# Patient Record
Sex: Female | Born: 1950 | Race: White | Hispanic: No | Smoking: Never smoker
Health system: Southern US, Community
[De-identification: ages and names within clinical notes are randomized; demographics above are authoritative.]

## PROBLEM LIST (undated history)

## (undated) DIAGNOSIS — I10 Essential (primary) hypertension: Secondary | ICD-10-CM

## (undated) DIAGNOSIS — C801 Malignant (primary) neoplasm, unspecified: Secondary | ICD-10-CM

---

## 2004-07-18 ENCOUNTER — Ambulatory Visit: Payer: Self-pay | Admitting: Obstetrics and Gynecology

## 2005-07-25 ENCOUNTER — Ambulatory Visit: Payer: Self-pay | Admitting: Obstetrics and Gynecology

## 2006-07-31 ENCOUNTER — Ambulatory Visit: Payer: Self-pay | Admitting: Obstetrics and Gynecology

## 2007-08-07 ENCOUNTER — Ambulatory Visit: Payer: Self-pay | Admitting: Obstetrics and Gynecology

## 2008-08-19 ENCOUNTER — Ambulatory Visit: Payer: Self-pay

## 2009-09-01 ENCOUNTER — Ambulatory Visit: Payer: Self-pay

## 2010-09-12 ENCOUNTER — Ambulatory Visit: Payer: Self-pay

## 2011-09-21 ENCOUNTER — Ambulatory Visit: Payer: Self-pay

## 2012-09-23 ENCOUNTER — Ambulatory Visit: Payer: Self-pay

## 2013-10-01 ENCOUNTER — Ambulatory Visit: Payer: Self-pay

## 2014-10-05 ENCOUNTER — Other Ambulatory Visit: Payer: Self-pay | Admitting: Obstetrics and Gynecology

## 2014-10-05 DIAGNOSIS — Z1231 Encounter for screening mammogram for malignant neoplasm of breast: Secondary | ICD-10-CM

## 2014-10-14 ENCOUNTER — Ambulatory Visit
Admission: RE | Admit: 2014-10-14 | Discharge: 2014-10-14 | Disposition: A | Discharge status: Home / Self Care | Payer: BC Managed Care – PPO | Source: Ambulatory Visit | Attending: Obstetrics and Gynecology | Admitting: Obstetrics and Gynecology

## 2014-10-14 DIAGNOSIS — Z1231 Encounter for screening mammogram for malignant neoplasm of breast: Secondary | ICD-10-CM

## 2015-10-11 ENCOUNTER — Other Ambulatory Visit: Payer: Self-pay | Admitting: Obstetrics and Gynecology

## 2015-10-11 DIAGNOSIS — Z1231 Encounter for screening mammogram for malignant neoplasm of breast: Secondary | ICD-10-CM

## 2015-10-19 ENCOUNTER — Ambulatory Visit
Admission: RE | Admit: 2015-10-19 | Discharge: 2015-10-19 | Disposition: A | Discharge status: Home / Self Care | Payer: BC Managed Care – PPO | Source: Ambulatory Visit | Attending: Obstetrics and Gynecology | Admitting: Obstetrics and Gynecology

## 2015-10-19 ENCOUNTER — Other Ambulatory Visit: Payer: Self-pay | Admitting: Obstetrics and Gynecology

## 2015-10-19 DIAGNOSIS — Z1231 Encounter for screening mammogram for malignant neoplasm of breast: Secondary | ICD-10-CM | POA: Diagnosis not present

## 2015-10-20 ENCOUNTER — Other Ambulatory Visit: Payer: Self-pay | Admitting: Obstetrics and Gynecology

## 2015-10-20 DIAGNOSIS — R928 Other abnormal and inconclusive findings on diagnostic imaging of breast: Secondary | ICD-10-CM

## 2015-10-27 ENCOUNTER — Ambulatory Visit
Admission: RE | Admit: 2015-10-27 | Discharge: 2015-10-27 | Disposition: A | Discharge status: Home / Self Care | Payer: Medicare Other | Source: Ambulatory Visit | Attending: Obstetrics and Gynecology | Admitting: Obstetrics and Gynecology

## 2015-10-27 DIAGNOSIS — R928 Other abnormal and inconclusive findings on diagnostic imaging of breast: Secondary | ICD-10-CM | POA: Insufficient documentation

## 2015-10-27 DIAGNOSIS — R590 Localized enlarged lymph nodes: Secondary | ICD-10-CM | POA: Diagnosis not present

## 2015-11-01 ENCOUNTER — Other Ambulatory Visit: Payer: Self-pay | Admitting: Obstetrics and Gynecology

## 2015-11-01 DIAGNOSIS — R590 Localized enlarged lymph nodes: Secondary | ICD-10-CM

## 2015-11-10 ENCOUNTER — Ambulatory Visit
Admission: RE | Admit: 2015-11-10 | Discharge: 2015-11-10 | Disposition: A | Discharge status: Home / Self Care | Payer: Medicare Other | Source: Ambulatory Visit | Attending: Obstetrics and Gynecology | Admitting: Obstetrics and Gynecology

## 2015-11-10 DIAGNOSIS — R590 Localized enlarged lymph nodes: Secondary | ICD-10-CM

## 2016-06-18 ENCOUNTER — Ambulatory Visit
Admission: RE | Admit: 2016-06-18 | Discharge: 2016-06-18 | Disposition: A | Discharge status: Home / Self Care | Payer: Medicare Other | Source: Ambulatory Visit | Attending: Internal Medicine | Admitting: Internal Medicine

## 2016-06-18 ENCOUNTER — Other Ambulatory Visit: Payer: Self-pay | Admitting: Internal Medicine

## 2016-06-18 DIAGNOSIS — C8599 Non-Hodgkin lymphoma, unspecified, extranodal and solid organ sites: Secondary | ICD-10-CM | POA: Diagnosis present

## 2016-06-18 DIAGNOSIS — R590 Localized enlarged lymph nodes: Secondary | ICD-10-CM | POA: Insufficient documentation

## 2016-06-18 HISTORY — DX: Essential (primary) hypertension: I10

## 2016-06-18 LAB — POCT I-STAT CREATININE: Creatinine, Ser: 0.8 mg/dL (ref 0.44–1.00)

## 2016-06-18 MED ORDER — IOPAMIDOL (ISOVUE-300) INJECTION 61%
75.0000 mL | Freq: Once | INTRAVENOUS | Status: AC | PRN
  Administered 2016-06-18: 75 mL via INTRAVENOUS

## 2016-06-22 ENCOUNTER — Other Ambulatory Visit: Payer: Self-pay | Admitting: Unknown Physician Specialty

## 2016-06-22 DIAGNOSIS — R221 Localized swelling, mass and lump, neck: Secondary | ICD-10-CM

## 2016-06-29 ENCOUNTER — Other Ambulatory Visit: Payer: Self-pay | Admitting: Physician Assistant

## 2016-07-02 ENCOUNTER — Ambulatory Visit
Admission: RE | Admit: 2016-07-02 | Discharge: 2016-07-02 | Disposition: A | Discharge status: Home / Self Care | Payer: Medicare Other | Source: Ambulatory Visit | Attending: Unknown Physician Specialty | Admitting: Unknown Physician Specialty

## 2016-07-02 DIAGNOSIS — R590 Localized enlarged lymph nodes: Secondary | ICD-10-CM | POA: Insufficient documentation

## 2016-07-02 DIAGNOSIS — R221 Localized swelling, mass and lump, neck: Secondary | ICD-10-CM

## 2016-07-02 DIAGNOSIS — C8211 Follicular lymphoma grade II, lymph nodes of head, face, and neck: Secondary | ICD-10-CM | POA: Diagnosis not present

## 2016-07-02 MED ORDER — FENTANYL CITRATE (PF) 100 MCG/2ML IJ SOLN
INTRAMUSCULAR | Status: AC
  Filled 2016-07-02: qty 2

## 2016-07-02 MED ORDER — MIDAZOLAM HCL 5 MG/5ML IJ SOLN
INTRAMUSCULAR | Status: AC
  Filled 2016-07-02: qty 5

## 2016-07-02 MED ORDER — SODIUM CHLORIDE 0.9 % IV SOLN: INTRAVENOUS | Status: DC

## 2016-07-02 NOTE — Procedures (Signed)
L supraclavicualr LN Bx 18 g core times 10 No comp/EBL

## 2016-07-06 ENCOUNTER — Encounter: Payer: Self-pay | Admitting: Unknown Physician Specialty

## 2016-07-06 ENCOUNTER — Encounter: Payer: Self-pay | Admitting: Interventional Radiology

## 2016-07-06 LAB — SURGICAL PATHOLOGY

## 2016-07-18 ENCOUNTER — Inpatient Hospital Stay: Payer: Medicare Other | Attending: Hematology and Oncology | Admitting: Hematology and Oncology

## 2016-07-18 ENCOUNTER — Inpatient Hospital Stay: Payer: Medicare Other

## 2016-07-18 VITALS — BP 137/89 | HR 99 | Temp 98.4°F | Resp 18 | Ht 62.0 in | Wt 211.9 lb

## 2016-07-18 DIAGNOSIS — I10 Essential (primary) hypertension: Secondary | ICD-10-CM | POA: Insufficient documentation

## 2016-07-18 DIAGNOSIS — C8218 Follicular lymphoma grade II, lymph nodes of multiple sites: Secondary | ICD-10-CM

## 2016-07-18 DIAGNOSIS — Z79899 Other long term (current) drug therapy: Secondary | ICD-10-CM | POA: Insufficient documentation

## 2016-07-18 DIAGNOSIS — R590 Localized enlarged lymph nodes: Secondary | ICD-10-CM | POA: Insufficient documentation

## 2016-07-18 LAB — COMPREHENSIVE METABOLIC PANEL
ALT: 28 U/L (ref 14–54)
AST: 29 U/L (ref 15–41)
Albumin: 4.8 g/dL (ref 3.5–5.0)
Alkaline Phosphatase: 100 U/L (ref 38–126)
Anion gap: 8 (ref 5–15)
BUN: 26 mg/dL — ABNORMAL HIGH (ref 6–20)
CO2: 29 mmol/L (ref 22–32)
Calcium: 9.8 mg/dL (ref 8.9–10.3)
Chloride: 101 mmol/L (ref 101–111)
Creatinine, Ser: 0.84 mg/dL (ref 0.44–1.00)
GFR calc Af Amer: >60 mL/min (ref >60)
GFR calc non Af Amer: >60 mL/min (ref >60)
Glucose, Bld: 102 mg/dL — ABNORMAL HIGH (ref 65–99)
Potassium: 3 mmol/L — ABNORMAL LOW (ref 3.5–5.1)
Sodium: 138 mmol/L (ref 135–145)
Total Bilirubin: 0.5 mg/dL (ref 0.3–1.2)
Total Protein: 7.8 g/dL (ref 6.5–8.1)

## 2016-07-18 LAB — CBC WITH DIFFERENTIAL/PLATELET
Basophils Absolute: 0 10*3/uL (ref 0–0.1)
Basophils Relative: 1 %
Eosinophils Absolute: 0.2 10*3/uL (ref 0–0.7)
Eosinophils Relative: 3 %
HCT: 41.6 % (ref 35.0–47.0)
Hemoglobin: 13.9 g/dL (ref 12.0–16.0)
Lymphocytes Relative: 18 %
Lymphs Abs: 1.1 10*3/uL (ref 1.0–3.6)
MCH: 28.6 pg (ref 26.0–34.0)
MCHC: 33.3 g/dL (ref 32.0–36.0)
MCV: 85.8 fL (ref 80.0–100.0)
Monocytes Absolute: 0.5 10*3/uL (ref 0.2–0.9)
Monocytes Relative: 8 %
Neutro Abs: 4.5 10*3/uL (ref 1.4–6.5)
Neutrophils Relative %: 70 %
Platelets: 277 10*3/uL (ref 150–440)
RBC: 4.85 MIL/uL (ref 3.80–5.20)
RDW: 14.8 % — ABNORMAL HIGH (ref 11.5–14.5)
WBC: 6.3 10*3/uL (ref 3.6–11.0)

## 2016-07-18 LAB — URIC ACID: Uric Acid, Serum: 7 mg/dL — ABNORMAL HIGH (ref 2.3–6.6)

## 2016-07-18 LAB — PROTIME-INR
INR: 0.92
Prothrombin Time: 12.3 seconds (ref 11.4–15.2)

## 2016-07-18 LAB — LACTATE DEHYDROGENASE: LDH: 92 U/L — ABNORMAL LOW (ref 98–192)

## 2016-07-18 LAB — APTT: aPTT: 26 seconds (ref 24–36)

## 2016-07-18 NOTE — Progress Notes (Signed)
Patient here today as new evaluation regarding supraclavicular lymph node.  Referred by Dr. Tami Ribas.  Patient states when she had her biopsy she later broke out in a rash.  She is unsure if the reaction was to what her skin was prepped with or what was injected into her neck.

## 2016-07-18 NOTE — Progress Notes (Signed)
[facility] day:  07/18/2016  Chief Complaint: Alexandra Moore is a 66 y.o. female with follicular lymphoma who is referred in consultation by Dr. Beverly Gust for assessment and management.  HPI:   The patient underwent diagnostic right mammogram and ultrasound on 10/27/2015.  Imaging revealed right axillary adenopathy with a dominant node measuring up to 3.9 cm with a diffusely thickened cortex.  There were no masses in the right chest.  Ultrasound guided right axillary core biopsy was recommended.   She declined a biopsy.  The patient notes a history of neck adenopathy in 2018.  She thought she might have had a cold and thus took amoxicillin.  As her adenopathy appeared to be getting worse, she was seen in the clinic.  She presented to Dr. Emily Filbert on 06/18/2016 with a several month history of left neck adenopathy.  Adenopathy appeared to wax and wane initially, but then enlarged.  She noted night sweats, but no weight loss.  Exam revealed large left cervical lymph nodes and bilateral axillary nodes.   She states that her blood tests were "clear". She was referred to ENT.   Chest CT on 06/18/2016 revealed left supraclavicular and bilateral axillary adenopathy.  The largest left axillary node measured 2 cm.  The largest right axillary node measured 3.5 x 2.2 cm.  There was no mediastinal or hilar adenopathy.  There was a calcified right hilar lymph nodes likely secondary to prior granulomatous disease. There was a 9 mm calcified granuloma in right lower lobe laterally.  Ultrasound guided biopsy of a left supraclavicular node on 07/02/2016 revealed grade I-II follicular lymphoma.  She notes that the left neck adenopathy has improved.  She notes having a rash post biopsy possibly related to betadine or the lidocaine injection.  She denies any B symptoms.  She has a rare cough.  She has never had a colonoscopy as she "does not believe in them".  She  states that she does "not like the invasiveness".     Past Medical History:  Diagnosis Date  . Hypertension     No past surgical history on file.  Family History  Problem Relation Age of Onset  . Breast cancer Neg Hx     Social History:  reports that she has never smoked. She has never used smokeless tobacco. She reports that she drinks alcohol. She reports that she does not use drugs.  She drinks wine once a month.  She denies any exposure to radiation or toxins.  She retired in 10/2015.  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  She previously lived in [place].  She moved to [place] in 1993.  She lives in [place].  The patient is alone today.  Allergies:  Allergies  Allergen Reactions  . Sulfa Antibiotics Hives    Current Medications: Current Outpatient Prescriptions  Medication Sig Dispense Refill  . amLODipine (NORVASC) 5 MG tablet Take 5 mg by mouth daily.    . Cholecalciferol (VITAMIN D3) 1000 units CAPS Take 1,000 Units by mouth daily.    . Flaxseed Oil OIL Use once daily.    . Garlic Oil 2 MG CAPS Take 1,000 mg by mouth daily.    Marland Kitchen GLUCOSAMINE SULFATE PO Take 1 tablet by mouth daily.    . magnesium gluconate (MAGONATE) 500 MG tablet Take 500 mg by mouth 2 (two) times daily.    . Multiple Vitamin (MULTI-VITAMINS) TABS Take 1 tablet by mouth daily.    Marland Kitchen  Oil of Oregano 1500 MG CAPS Take 1,500 mg by mouth daily.    . Probiotic Product (PROBIOTIC-10) CAPS Take 70 mg by mouth daily.    . TURMERIC PO Take 400 mg by mouth daily.    . vitamin E 400 UNIT capsule Take 400 Units by mouth daily.     No current facility-administered medications for this visit.     Review of Systems:  GENERAL:  Feels good.  No fevers, sweats or weight loss. PERFORMANCE STATUS (ECOG):  0 HEENT:  No visual changes, runny nose, sore throat, mouth sores or tenderness. Lungs: No shortness of breath.  Rare cough.  No hemoptysis. Cardiac:  No chest pain, palpitations, orthopnea, or  PND. GI:  No nausea, vomiting, diarrhea, constipation, melena or hematochezia. No prior colonoscopy. GU:  No urgency, frequency, dysuria, or hematuria. Musculoskeletal:  No back pain.  Knee pain, sometimes.  No muscle tenderness. Extremities:  No pain or swelling. Skin:  No rashes or skin changes. Neuro:  No headache, numbness or weakness, balance or coordination issues. Endocrine:  No diabetes, thyroid issues, hot flashes or night sweats. Psych:  No mood changes, depression or anxiety. Pain:  No focal pain. Review of systems:  All other systems reviewed and found to be negative.  Physical Exam: Blood pressure 137/89, pulse 99, temperature 98.4 F (36.9 C), temperature source Tympanic, resp. rate 18, height _0  (1.575 m), weight 211 lb 13.8 oz (96.1 kg). GENERAL:  Well developed, well nourished, woman sitting comfortably in the exam room in no acute distress. MENTAL STATUS:  Alert and oriented to person, place and time. HEAD:  Curly brown hair.  Normocephalic, atraumatic, face symmetric, no Cushingoid features. EYES:  Glasses.  Blue eyes.  Pupils equal round and reactive to light and accomodation.  No conjunctivitis or scleral icterus. ENT:  Oropharynx clear without lesion.  Tongue normal. Mucous membranes moist.  RESPIRATORY:  Clear to auscultation without rales, wheezes or rhonchi. CARDIOVASCULAR:  Regular rate and rhythm without murmur, rub or gallop. ABDOMEN:  Soft, non-tender, with active bowel sounds, and no appreciable hepatosplenomegaly.  No masses. SKIN:  No rashes, ulcers or lesions. EXTREMITIES: No edema, no skin discoloration or tenderness.  No palpable cords. LYMPH NODES: Low anterior cervical 2 1/2 cm node.  High left neck 3 cm node.  Small axillary adenopathy (right> left).  Shotty right inguinal node.  NEUROLOGICAL: Unremarkable. PSYCH:  Appropriate.   Office Visit on 07/18/2016  Component Date Value Ref Range Status  . WBC 07/18/2016 6.3  3.6 - 11.0 K/uL Final  .  RBC 07/18/2016 4.85  3.80 - 5.20 MIL/uL Final  . Hemoglobin 07/18/2016 13.9  12.0 - 16.0 g/dL Final  . HCT 07/18/2016 41.6  35.0 - 47.0 % Final  . MCV 07/18/2016 85.8  80.0 - 100.0 fL Final  . MCH 07/18/2016 28.6  26.0 - 34.0 pg Final  . MCHC 07/18/2016 33.3  32.0 - 36.0 g/dL Final  . RDW 07/18/2016 14.8* 11.5 - 14.5 % Final  . Platelets 07/18/2016 277  150 - 440 K/uL Final  . Neutrophils Relative % 07/18/2016 70  % Final  . Neutro Abs 07/18/2016 4.5  1.4 - 6.5 K/uL Final  . Lymphocytes Relative 07/18/2016 18  % Final  . Lymphs Abs 07/18/2016 1.1  1.0 - 3.6 K/uL Final  . Monocytes Relative 07/18/2016 8  % Final  . Monocytes Absolute 07/18/2016 0.5  0.2 - 0.9 K/uL Final  . Eosinophils Relative 07/18/2016 3  % Final  . Eosinophils  Absolute 07/18/2016 0.2  0 - 0.7 K/uL Final  . Basophils Relative 07/18/2016 1  % Final  . Basophils Absolute 07/18/2016 0.0  0 - 0.1 K/uL Final  . Sodium 07/18/2016 138  135 - 145 mmol/L Final  . Potassium 07/18/2016 3.0* 3.5 - 5.1 mmol/L Final  . Chloride 07/18/2016 101  101 - 111 mmol/L Final  . CO2 07/18/2016 29  22 - 32 mmol/L Final  . Glucose, Bld 07/18/2016 102* 65 - 99 mg/dL Final  . BUN 07/18/2016 26* 6 - 20 mg/dL Final  . Creatinine, Ser 07/18/2016 0.84  0.44 - 1.00 mg/dL Final  . Calcium 07/18/2016 9.8  8.9 - 10.3 mg/dL Final  . Total Protein 07/18/2016 7.8  6.5 - 8.1 g/dL Final  . Albumin 07/18/2016 4.8  3.5 - 5.0 g/dL Final  . AST 07/18/2016 29  15 - 41 U/L Final  . ALT 07/18/2016 28  14 - 54 U/L Final  . Alkaline Phosphatase 07/18/2016 100  38 - 126 U/L Final  . Total Bilirubin 07/18/2016 0.5  0.3 - 1.2 mg/dL Final  . GFR calc non Af Amer 07/18/2016 >60  >60 mL/min Final  . GFR calc Af Amer 07/18/2016 >60  >60 mL/min Final   Comment: (NOTE) The eGFR has been calculated using the CKD EPI equation. This calculation has not been validated in all clinical situations. eGFR's persistently <60 mL/min signify possible Chronic Kidney Disease.   .  Anion gap 07/18/2016 8  5 - 15 Final  . LDH 07/18/2016 92* 98 - 192 U/L Final  . Uric Acid, Serum 07/18/2016 7.0* 2.3 - 6.6 mg/dL Final  . Beta-2 Microglobulin 07/18/2016 2.2  0.6 - 2.4 mg/L Final   Comment: (NOTE) Siemens Immulite 2000 Immunochemiluminometric assay [facility]) Performed At: [facility] [place], [place] [id number] Lindon Romp MD KN:[id number]   . Prothrombin Time 07/18/2016 12.3  11.4 - 15.2 seconds Final  . INR 07/18/2016 0.92   Final  . aPTT 07/18/2016 26  24 - 36 seconds Final    Assessment:  Alexandra Moore is a 66 y.o. female with clinical stage IIA follicular lymphoma.  She initially developed right axillary adenopathy in 10/2015.  She then developed waxing and waning left neck adenopathy.  Ultrasound guided biopsy of a left supraclavicular node on 07/02/2016 revealed grade I-II follicular lymphoma.  Chest CT on 06/18/2016 revealed left supraclavicular and bilateral axillary adenopathy.  The largest left axillary node measured 2 cm.  The largest right axillary node measured 3.5 x 2.2 cm.  There was no mediastinal or hilar adenopathy.  There was a calcified right hilar lymph nodes likely secondary to prior granulomatous disease. There was a 9 mm calcified granuloma in right lower lobe laterally.  Symptomatically, she denies any B symptoms.  She has palpable adenopathy in the left neck and axillae.  Plan: 1.  Discuss diagnosis, staging and management of follicular non-Hodgkin's lymphoma.  Discuss staging studies (PET scan and bone marrow aspirate and biopsy).  Procedures described in detail.  Discuss treatment of localized (stage I-II) versus extensive (stage III-IV) follicular non-Hodgkin's lymphoma.  Most patients have stage III or IV disease.  Discuss indications for treatment in stage III-IV disease (B symptoms, bulky or disfiguring adenopathy, rapidly progressive disease, threatened organ function, cytopenias, or  autoimmune disease).  Discuss  treatment with Rituxan alone, Rituxan plus bendamustine (BR), RCVP, or RCHOP.  Side effects of Rituxan reviewed.  Discussed checking hepatitis serologies (risk of reactivation with Rituxan). 2.  Labs  today:  CBC with diff, CMP, LDH, uric acid, beta 2-microglobulin, hepatitis B surface antigen, hepatitis B core antibody total, hepatitis C antibody, PT, PTT. 3.  Schedule PET scan. 4.  Schedule bone marrow aspirate and biopsy. 5.  RTC 10 days after bone marrow aspirate and biopsy.   Lequita Asal, MD  07/18/2016

## 2016-07-19 ENCOUNTER — Telehealth: Payer: Self-pay | Admitting: *Deleted

## 2016-07-19 LAB — BETA 2 MICROGLOBULIN, SERUM: Beta-2 Microglobulin: 2.2 mg/L (ref 0.6–2.4)

## 2016-07-19 NOTE — Telephone Encounter (Signed)
Called patient to inform her that her K+ is low.  She denies taking diuretics or having diarrhea.  She was @ PCP office when I called.  She will inform him of low K+.  Also sent labs to PCP.

## 2016-07-19 NOTE — Telephone Encounter (Signed)
-----   Message from Lequita Asal, MD sent at 07/18/2016  5:24 PM EDT ----- Regarding: Please call patient and send lab to PCP  Potassium is low.  Any diarrhea or taking diuretics?  M  ----- Message ----- From: Interface, Lab In [place] Sent: 07/18/2016   4:56 PM To: Lequita Asal, MD

## 2016-07-21 ENCOUNTER — Encounter: Payer: Self-pay | Admitting: Hematology and Oncology

## 2016-07-24 ENCOUNTER — Encounter
Admission: RE | Admit: 2016-07-24 | Discharge: 2016-07-24 | Disposition: A | Discharge status: Home / Self Care | Payer: Medicare Other | Source: Ambulatory Visit | Attending: Hematology and Oncology | Admitting: Hematology and Oncology

## 2016-07-24 DIAGNOSIS — C8218 Follicular lymphoma grade II, lymph nodes of multiple sites: Secondary | ICD-10-CM | POA: Diagnosis not present

## 2016-07-24 LAB — GLUCOSE, CAPILLARY: Glucose-Capillary: 80 mg/dL (ref 65–99)

## 2016-07-24 MED ORDER — FLUDEOXYGLUCOSE F - 18 (FDG) INJECTION
12.9600 | Freq: Once | INTRAVENOUS | Status: AC | PRN
  Administered 2016-07-24: 12.96 via INTRAVENOUS

## 2016-07-31 ENCOUNTER — Ambulatory Visit
Admission: RE | Admit: 2016-07-31 | Discharge: 2016-07-31 | Disposition: A | Discharge status: Home / Self Care | Payer: Medicare Other | Source: Ambulatory Visit | Attending: Hematology and Oncology | Admitting: Hematology and Oncology

## 2016-07-31 ENCOUNTER — Other Ambulatory Visit (HOSPITAL_COMMUNITY)
Admission: RE | Admit: 2016-07-31 | Disposition: A | Discharge status: Home / Self Care | Payer: Medicare Other | Source: Other Acute Inpatient Hospital | Attending: Hematology and Oncology | Admitting: Hematology and Oncology

## 2016-07-31 DIAGNOSIS — R590 Localized enlarged lymph nodes: Secondary | ICD-10-CM | POA: Diagnosis not present

## 2016-07-31 DIAGNOSIS — I10 Essential (primary) hypertension: Secondary | ICD-10-CM | POA: Diagnosis not present

## 2016-07-31 DIAGNOSIS — R21 Rash and other nonspecific skin eruption: Secondary | ICD-10-CM | POA: Diagnosis not present

## 2016-07-31 DIAGNOSIS — C8218 Follicular lymphoma grade II, lymph nodes of multiple sites: Secondary | ICD-10-CM | POA: Diagnosis present

## 2016-07-31 LAB — DIFFERENTIAL
Basophils Absolute: 0 10*3/uL (ref 0–0.1)
Basophils Relative: 1 %
EOS ABS: 0.2 10*3/uL (ref 0–0.7)
EOS PCT: 3 %
Lymphocytes Relative: 22 %
Lymphs Abs: 1.1 10*3/uL (ref 1.0–3.6)
MONO ABS: 0.4 10*3/uL (ref 0.2–0.9)
MONOS PCT: 8 %
Neutro Abs: 3.5 10*3/uL (ref 1.4–6.5)
Neutrophils Relative %: 66 %

## 2016-07-31 LAB — CBC
HCT: 41.8 % (ref 35.0–47.0)
Hemoglobin: 14.2 g/dL (ref 12.0–16.0)
MCH: 29.5 pg (ref 26.0–34.0)
MCHC: 34 g/dL (ref 32.0–36.0)
MCV: 86.8 fL (ref 80.0–100.0)
PLATELETS: 254 10*3/uL (ref 150–440)
RBC: 4.82 MIL/uL (ref 3.80–5.20)
RDW: 14.8 % — ABNORMAL HIGH (ref 11.5–14.5)
WBC: 5 10*3/uL (ref 3.6–11.0)

## 2016-07-31 LAB — APTT: APTT: 27 s (ref 24–36)

## 2016-07-31 LAB — PROTIME-INR
INR: 0.93
PROTHROMBIN TIME: 12.5 s (ref 11.4–15.2)

## 2016-07-31 MED ORDER — MIDAZOLAM HCL 2 MG/2ML IJ SOLN
INTRAMUSCULAR | Status: AC | PRN
  Administered 2016-07-31 (×2): 1 mg via INTRAVENOUS

## 2016-07-31 MED ORDER — FENTANYL CITRATE (PF) 100 MCG/2ML IJ SOLN
INTRAMUSCULAR | Status: AC | PRN
  Administered 2016-07-31 (×2): 50 ug via INTRAVENOUS

## 2016-07-31 MED ORDER — SODIUM CHLORIDE 0.9 % IV SOLN
INTRAVENOUS | Status: DC
  Administered 2016-07-31: 08:00:00 via INTRAVENOUS

## 2016-07-31 NOTE — H&P (Signed)
Chief Complaint: Patient was seen in consultation today for No chief complaint on file.  at the request of [place] C  Referring Physician(s): [place] C  Supervising Physician: Marybelle Killings  Patient Status: [facility] - Out-pt  History of Present Illness: Alexandra Moore is a 66 y.o. female with lymphoma who is referred for a bone marrow biopsy. She has no complaints. She developed a rash from the adhesive component of the drape during her last supracalvicular biopsy.  Past Medical History:  Diagnosis Date  . Hypertension     No past surgical history on file.  Allergies: Sulfa antibiotics  Medications: Prior to Admission medications   Medication Sig Start Date End Date Taking? Authorizing Provider  amLODipine (NORVASC) 5 MG tablet Take 5 mg by mouth daily.    Historical Provider, MD  Cholecalciferol (VITAMIN D3) 1000 units CAPS Take 1,000 Units by mouth daily.    Historical Provider, MD  Flaxseed Oil OIL Use once daily.    Historical Provider, MD  Garlic Oil 2 MG CAPS Take 1,000 mg by mouth daily.    Historical Provider, MD  GLUCOSAMINE SULFATE PO Take 1 tablet by mouth daily.    Historical Provider, MD  magnesium gluconate (MAGONATE) 500 MG tablet Take 500 mg by mouth 2 (two) times daily.    Historical Provider, MD  Multiple Vitamin (MULTI-VITAMINS) TABS Take 1 tablet by mouth daily.    Historical Provider, MD  Oil of Oregano 1500 MG CAPS Take 1,500 mg by mouth daily.    Historical Provider, MD  Probiotic Product (PROBIOTIC-10) CAPS Take 70 mg by mouth daily.    Historical Provider, MD  TURMERIC PO Take 400 mg by mouth daily.    Historical Provider, MD  vitamin E 400 UNIT capsule Take 400 Units by mouth daily.    Historical Provider, MD     Family History  Problem Relation Age of Onset  . Breast cancer Neg Hx     Social History   Social History  . Marital status: Married    Spouse name: N/A  . Number of children: N/A  . Years of education: N/A    Social History Main Topics  . Smoking status: Never Smoker  . Smokeless tobacco: Never Used  . Alcohol use Yes     Comment: rarely  . Drug use: No  . Sexual activity: Not on file   Other Topics Concern  . Not on file   Social History Narrative  . No narrative on file      Review of Systems: A 12 point ROS discussed and pertinent positives are indicated in the HPI above.  All other systems are negative.  Review of Systems  Vital Signs: BP (!) 157/89   Temp 98 F (36.7 C) (Oral)   Resp 12   SpO2 100%   Physical Exam  Constitutional: She is oriented to person, place, and time. She appears well-developed and well-nourished.  HENT:  Head: Normocephalic and atraumatic.  Cardiovascular: Normal rate and regular rhythm.   Pulmonary/Chest: Effort normal and breath sounds normal.  Neurological: She is alert and oriented to person, place, and time.    Mallampati Score: 1    Imaging: Nm Pet Image Initial (pi) Skull Base To Thigh  Result Date: 07/24/2016 CLINICAL DATA:  Initial treatment strategy for grade 2 follicular lymphoma. EXAM: NUCLEAR MEDICINE PET SKULL BASE TO THIGH TECHNIQUE: 13.0 mCi F-18 FDG was injected intravenously. Full-ring PET imaging was performed from the skull base to thigh after the  radiotracer. CT data was obtained and used for attenuation correction and anatomic localization. FASTING BLOOD GLUCOSE:  Value: 80 mg/dl COMPARISON:  Chest CT from 06/18/2016 FINDINGS: NECK Bilateral level IIa, left IIb, left level V, left level III, and left supraclavicular and infraclavicular adenopathy noted along with mildly hypermetabolic right level Ib adenopathy. Index level IIa lymph node measuring 1.8 cm in short axis on image 37/3 has a maximum standard uptake value of 9.7. CHEST Hypermetabolic left upper mediastinal, right paratracheal, AP window, and bilateral axillary adenopathy noted. Index left axillary node 1.8 cm in short axis on image 76/3, maximum SUV 6.9. Index AP  window lymph node 1.4 cm in short axis on image 83/3, maximum SUV 6.9. Background mediastinal blood pool activity SUV 2.7. There is evidence of old granulomatous disease. Atherosclerotic aortic arch. ABDOMEN/PELVIS Hypermetabolic retroperitoneal adenopathy in the periaortic and aortocaval chains, with hypermetabolic common iliac, external iliac, and inguinal adenopathy. Index left periaortic lymph node 1.4 cm in short axis on image 167/3, maximum SUV 7.9. Index right external iliac node 2.7 cm in short axis on image 212/3, maximum SUV 9.2. Right inguinal lymph node 2.0 cm in short axis on image 226/3, maximum SUV 8.9. No hypermetabolic splenic activity or splenomegaly. Background hepatic activity SUV 3.5. Any elongated gallstone measuring nearly 4 cm in long axis is present. Lobular appearance of the uterus likely from chronic fibroids. SKELETON Mild marrow heterogeneity but without a well-defined region of osseous malignant involvement. IMPRESSION: 1. Hypermetabolic adenopathy in the neck, chest, abdomen, and pelvis, compatible with lymphoma as detailed above. 2. Mild marrow heterogeneity of activity but without a well-defined region of osseous malignant involvement. 3. Other imaging findings of potential clinical significance: Elongated gallstone. Old granulomatous disease in the chest. Atherosclerotic calcification of the aortic arch. Electronically Signed   By: Van Clines M.D.   On: 07/24/2016 14:02   US Biopsy  Result Date: 07/02/2016 INDICATION: Lymphadenopathy EXAM: ULTRASOUND-GUIDED BIOPSY LEFT SUPRACLAVICULAR LYMPH NODE.  CORE. MEDICATIONS: None. ANESTHESIA/SEDATION: None. FLUOROSCOPY TIME:  None. COMPLICATIONS: None immediate. PROCEDURE: Informed written consent was obtained from the patient after a thorough discussion of the procedural risks, benefits and alternatives. All questions were addressed. Maximal Sterile Barrier Technique was utilized including caps, mask, sterile gowns, sterile  gloves, sterile drape, hand hygiene and skin antiseptic. A timeout was performed prior to the initiation of the procedure. The left neck was prepped with ChloraPrep in a sterile fashion, and a sterile drape was applied covering the operative field. A sterile gown and sterile gloves were used for the procedure. Under sonographic guidance, 10 18 gauge core biopsies of the enlarged left supraclavicular lymph node were obtained. Final imaging was performed. FINDINGS: The images document guide needle placement within the left supraclavicular lymph node. Post biopsy images demonstrate no hemorrhage. IMPRESSION: Successful ultrasound-guided core biopsy of a left supraclavicular lymph node. Electronically Signed   By: Marybelle Killings M.D.   On: 07/02/2016 11:40    Labs:  CBC:  Recent Labs  07/18/16 1636  WBC 6.3  HGB 13.9  HCT 41.6  PLT 277    COAGS:  Recent Labs  07/18/16 1636  INR 0.92  APTT 26    BMP:  Recent Labs  06/18/16 1448 07/18/16 1636  NA  --  138  K  --  3.0*  CL  --  101  CO2  --  29  GLUCOSE  --  102*  BUN  --  26*  CALCIUM  --  9.8  CREATININE 0.80 0.84  GFRNONAA  --  >60  GFRAA  --  >60    LIVER FUNCTION TESTS:  Recent Labs  07/18/16 1636  BILITOT 0.5  AST 29  ALT 28  ALKPHOS 100  PROT 7.8  ALBUMIN 4.8    TUMOR MARKERS: No results for input(s): AFPTM, CEA, CA199, CHROMGRNA in the last 8760 hours.  Assessment and Plan:  Lymphoma. Bone marrow biopsy to follow.  Thank you for this interesting consult.  I greatly enjoyed meeting KYARA BOXER and look forward to participating in their care.  A copy of this report was sent to the requesting provider on this date.  Electronically Signed: Morgyn Marut, ART A 07/31/2016, 8:02 AM   I spent a total of  40 Minutes   in face to face in clinical consultation, greater than 50% of which was counseling/coordinating care for bone marrow biopsy.

## 2016-07-31 NOTE — Procedures (Signed)
BM aspirate and Bx EBL 0 Comp 0

## 2016-07-31 NOTE — Discharge Instructions (Signed)
Bone Marrow Aspiration and Bone Marrow Biopsy, Adult Bone marrow aspiration and bone marrow biopsy are procedures that are done to diagnose blood disorders. You may also have one of these procedures to help diagnose infections or some types of cancer. Bone marrow is the soft tissue that is inside your bones. Blood cells are produced in bone marrow. For bone marrow aspiration, a sample of tissue in liquid form is removed from inside your bone. For a bone marrow biopsy, a small core of bone marrow tissue is removed. These samples are examined under a microscope or tested in a lab. You may need these procedures if you have an abnormal complete blood count (CBC). The aspiration or biopsy sample is usually taken from the top of your hip bone. Sometimes, an aspiration sample is taken from your chest bone (sternum). Tell a health care provider about:  Any allergies you have.  All medicines you are taking, including vitamins, herbs, eye drops, creams, and over-the-counter medicines.  Any problems you or family members have had with anesthetic medicines.  Any blood or bone disorders you have.  Any surgeries you have had.  Any medical conditions you have.  Whether you are pregnant or you think that you may be pregnant. What are the risks? Generally, this is a safe procedure. However, problems may occur, including:  Infection.  Bleeding.  Persistent pain after the procedure.  Cracking (fracture) of the bone.  Allergic reactions to medicines. What happens before the procedure? Staying hydrated  Follow instructions from your health care provider about hydration, which may include:  Up to 2 hours before the procedure - you may continue to drink clear liquids, such as water, clear fruit juice, black coffee, and plain tea. Eating and drinking restrictions  Follow instructions from your health care provider about eating and drinking, which may include:  8 hours before the procedure - stop  eating heavy meals or foods such as meat, fried foods, or fatty foods.  6 hours before the procedure - stop eating light meals or foods, such as toast or cereal.  6 hours before the procedure - stop drinking milk or drinks that contain milk.  2 hours before the procedure - stop drinking clear liquids. Medicines   Ask your health care provider about:  Changing or stopping your regular medicines. This is especially important if you are taking diabetes medicines or blood thinners.  Taking medicines such as aspirin and ibuprofen. These medicines can thin your blood. Do not take these medicines before your procedure if your health care provider instructs you not to.  You may be given antibiotic medicine to help prevent infection. General instructions   Plan to have someone take you home after the procedure.  If you will be going home right after the procedure, plan to have someone with you for 24 hours.  Ask your health care provider how your surgical site will be marked or identified. What happens during the procedure?  To reduce your risk of infection:  Your health care team will wash or sanitize their hands.  Your skin will be washed with soap.  Hair may be removed from the surgical area.  An IV tube may be inserted into one of your veins.  The injection site will be cleaned with a germ-killing solution (antiseptic).  You will be given one or more of the following:  A medicine to help you relax (sedative).  A medicine to numb the area (local anesthetic).  A medicine to make you fall  asleep (general anesthetic).  The bone marrow sample will be removed as follows:  For an aspiration, a hollow needle will be inserted through your skin and into your bone. Bone marrow fluid will be drawn up into a syringe.  For a biopsy, your health care provider will use a hollow needle to remove a core of tissue from your bone marrow.  The needle will be removed.  A bandage (dressing)  will be placed over the insertion site and taped in place. The procedure may vary among health care providers and hospitals. What happens after the procedure?  Your blood pressure, heart rate, breathing rate, and blood oxygen level will be monitored until the medicines you were given have worn off.  Your IV tube will be removed, and the insertion site will be checked for bleeding.  Do not drive for 24 hours if you were given a sedative. This information is not intended to replace advice given to you by your health care provider. Make sure you discuss any questions you have with your health care provider. Document Released: 04/12/2004 Document Revised: 10/28/2015 Document Reviewed: 09/21/2015 Elsevier Interactive Patient Education  2017 [employer].

## 2016-08-02 ENCOUNTER — Encounter: Payer: Self-pay | Admitting: Hematology and Oncology

## 2016-08-08 LAB — CHROMOSOME ANALYSIS, BONE MARROW

## 2016-08-15 ENCOUNTER — Inpatient Hospital Stay: Payer: Medicare Other | Attending: Hematology and Oncology | Admitting: Hematology and Oncology

## 2016-08-15 ENCOUNTER — Other Ambulatory Visit: Payer: Self-pay | Admitting: Hematology and Oncology

## 2016-08-15 VITALS — BP 145/90 | HR 93 | Temp 97.6°F | Resp 18 | Wt 212.1 lb

## 2016-08-15 DIAGNOSIS — C8218 Follicular lymphoma grade II, lymph nodes of multiple sites: Secondary | ICD-10-CM

## 2016-08-15 DIAGNOSIS — Z79899 Other long term (current) drug therapy: Secondary | ICD-10-CM | POA: Diagnosis not present

## 2016-08-15 DIAGNOSIS — I10 Essential (primary) hypertension: Secondary | ICD-10-CM | POA: Insufficient documentation

## 2016-08-15 NOTE — Progress Notes (Signed)
Lymphoma and CLL - No Medical Intervention - Off Treatment.  Patient Characteristics: Follicular, Grades 1, 2, and 3A, First Line, Stage III / IV, Asymptomatic or Low Bulk Disease Disease Type: Follicular Disease Type: Not Applicable Dorleen Arbor Stage: IV Tumor Grade: 2 Line of therapy: First Line Disease Characteristics: Asymptomatic or Low Bulk Disease

## 2016-08-15 NOTE — Progress Notes (Signed)
Patient here today for CT results.  Patient states she is anxious.  Also states she is going to the dentist tomorrow and wants to know if she should be on an antibiotic prior to having her teeth cleaned.

## 2016-08-15 NOTE — Progress Notes (Signed)
[facility]:  08/15/2016  Chief Complaint: Alexandra Moore is a 66 y.o. female with follicular lymphoma who is seen for review of work-up and discussion regarding direction of therapy.  HPI:   The patient was last seen in the medical oncology clinic on 07/18/2016 for initial consultation.   She developed right axillary adenopathy in 10/2015.  She then developed waxing and waning left neck adenopathy.  Ultrasound guided biopsy of a left supraclavicular node on 07/02/2016 revealed grade I-II follicular lymphoma.  She denied any B symptoms.  Exam revealed palpable adenopathy in the left neck and axillae.  We discussed staging studies.  Labs at last visit included a normal CBC with diff, CMP (except a potassium of 3.0), LDH (92), and beta 2-microglobulin (2.2).  Uric acid was 7.0 (2.3-6.6).  Hepatitis B core antibody total and hepatitis C testing was negative.  PET scan on 07/24/2016 revealed hypermetabolic adenopathy in the neck, chest, abdomen, and pelvis, compatible with lymphoma.  Index nodes included a level IIa neck node of 1.8 cm (SUV 9.7), left axillary node 1.8 cm (SUV 6.9), AP window node 1.4 cm (SUV 6.9), 1.4 cm left periaortic lymph node (SUV 7.9), 2.7 cm right external iliac node (SUV 9.2), and right inguinal node (SUV 8.9).  There was mild marrow heterogeneity of activity but without a well-defined region of osseous malignant involvement.  There was an elongated gallstone, old granulomatous disease in the chest, and atherosclerotic calcification of the aortic arch.  Bone marrow aspirate and biopsy on  07/31/2016 revealed a slightly hypercellular marrow for age with atypical lymphoid aggregates and trilineage hematopoiesis.  Flow cytometry revealed a minor B cell population with lambda light chain excess representing < 10% of the lymphocytes with expression of pan B-cell antigens including CD20.  These findings are worrisome but not definitive for  minimal involvement by a B-cell lymphoproliferative process.  Overall findings are consistent with involvment by non-Hodgkin's lymphoma.  Symptomatically, she feels great.  Lymph nodes appear to be growing and shrinking.  She has a GYN appointment on 10/18/2016.   Past Medical History:  Diagnosis Date  . Hypertension     No past surgical history on file.  Family History  Problem Relation Age of Onset  . Breast cancer Neg Hx     Social History:  reports that she has never smoked. She has never used smokeless tobacco. She reports that she drinks alcohol. She reports that she does not use drugs.  She drinks wine once a month.  She denies any exposure to radiation or toxins.  She retired in 10/2015.  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  She previously lived in [place].  She moved to [place] in 1993.  She lives in [place].  The patient is alone today.  Allergies:  Allergies  Allergen Reactions  . Sulfa Antibiotics Hives    Current Medications: Current Outpatient Prescriptions  Medication Sig Dispense Refill  . amLODipine (NORVASC) 5 MG tablet Take 5 mg by mouth daily.    . Cholecalciferol (VITAMIN D3) 1000 units CAPS Take 1,000 Units by mouth daily.    . Flaxseed Oil OIL Use once daily.    Marland Kitchen GLUCOSAMINE SULFATE PO Take 1 tablet by mouth daily.    . magnesium gluconate (MAGONATE) 500 MG tablet Take 500 mg by mouth 2 (two) times daily.    . Multiple Vitamin (MULTI-VITAMINS) TABS Take 1 tablet by mouth daily.    . Probiotic Product (  PROBIOTIC-10) CAPS Take 70 mg by mouth daily.    . vitamin E 400 UNIT capsule Take 400 Units by mouth daily.    . Garlic Oil 2 MG CAPS Take 1,000 mg by mouth daily.    . Oil of Oregano 1500 MG CAPS Take 1,500 mg by mouth daily.    . TURMERIC PO Take 400 mg by mouth daily.     No current facility-administered medications for this visit.     Review of Systems:  GENERAL:  Feels good.  No fevers, sweats or weight loss. PERFORMANCE STATUS  (ECOG):  0 HEENT:  No visual changes, runny nose, sore throat, mouth sores or tenderness. Lungs: No shortness of breath.  Rare cough.  No hemoptysis. Cardiac:  No chest pain, palpitations, orthopnea, or PND. GI:  No nausea, vomiting, diarrhea, constipation, melena or hematochezia. No prior colonoscopy. GU:  No urgency, frequency, dysuria, or hematuria. Musculoskeletal:  No back pain.  Knee pain, sometimes.  No muscle tenderness. Extremities:  No pain or swelling. Skin:  No rashes or skin changes. Neuro:  No headache, numbness or weakness, balance or coordination issues. Endocrine:  No diabetes, thyroid issues, hot flashes or night sweats. Psych:  No mood changes, depression or anxiety. Pain:  No focal pain. Review of systems:  All other systems reviewed and found to be negative.  Physical Exam: Blood pressure (!) 145/90, pulse 93, temperature 97.6 F (36.4 C), temperature source Tympanic, resp. rate 18, weight 212 lb 1.3 oz (96.2 kg). GENERAL:  Well developed, well nourished, woman sitting comfortably in the exam room in no acute distress. MENTAL STATUS:  Alert and oriented to person, place and time. HEAD:  Curly brown hair.  Normocephalic, atraumatic, face symmetric, no Cushingoid features. EYES:  Glasses.  Blue eyes.  No conjunctivitis or scleral icterus. NEUROLOGICAL: Unremarkable. PSYCH:  Appropriate.   No visits with results within 3 Day(s) from this visit.  Latest known visit with results is:  Hospital Outpatient Visit on 07/31/2016  Component Date Value Ref Range Status  . Chromosome-Routine 07/31/2016 SEE SEPARATE REPORT   Final   Performed at [facility]    Assessment:  Alexandra Moore is a 66 y.o. 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  She initially developed right axillary adenopathy in 10/2015.  She then developed waxing and waning left neck adenopathy.  Ultrasound guided biopsy of a left supraclavicular node on 07/02/2016 revealed grade I-II follicular  lymphoma.  Chest CT on 06/18/2016 revealed left supraclavicular and bilateral axillary adenopathy.  The largest left axillary node measured 2 cm.  The largest right axillary node measured 3.5 x 2.2 cm.  There was no mediastinal or hilar adenopathy.  There was a calcified right hilar lymph nodes likely secondary to prior granulomatous disease. There was a 9 mm calcified granuloma in right lower lobe laterally.  PET scan on 07/24/2016 revealed hypermetabolic adenopathy in the neck, chest, abdomen, and pelvis, compatible with lymphoma.  Index nodes included a level IIa neck node of 1.8 cm (SUV 9.7), left axillary node 1.8 cm (SUV 6.9), AP window node 1.4 cm (SUV 6.9), 1.4 cm left periaortic lymph node (SUV 7.9), 2.7 cm right external iliac node (SUV 9.2), and right inguinal node (SUV 8.9).  There was mild marrow heterogeneity of activity but without a well-defined region of osseous malignant involvement.   Bone marrow aspirate and biopsy on  07/31/2016 revealed a slightly hypercellular marrow for age with atypical lymphoid aggregates and trilineage hematopoiesis.  Flow cytometry revealed a minor  B cell population with lambda light chain excess representing < 10% of the lymphocytes with expression of pan B-cell antigens including CD20.  These findings are worrisome but not definitive for minimal involvement by a B-cell lymphoproliferative process.  Overall findings are consistent with involvment by non-Hodgkin's lymphoma.  Symptomatically, she denies any B symptoms.  She has palpable adenopathy in the left neck and axillae.  Plan: 1.  Discuss results of PET scan and bone marrow aspirate and biopsy.  She has lymph nodes above and below the diaphragm. Bone marrow reveals early involvement with lymphoma. She does not have any B symptoms. She has stage IV a disease. Discuss indications for systemic treatment including B symptoms, bulky or disfiguring adenopathy, rapidly progressive disease, threatened organ  function, cytopenias, or  autoimmune disease.  Lymph nodes appear to be waxing and waning.  Discuss surveillance.  Discuss treatment options when treatment is indicated.  Patient felt comfortable with close observation.  Several questions were asked and answered. 2.  RTC in 3 months for MD assessment and labs (CBC with diff, CMP, uric acid, LDH).   Lequita Asal, MD  08/15/2016

## 2016-08-17 ENCOUNTER — Encounter (HOSPITAL_COMMUNITY): Payer: Self-pay

## 2016-08-20 ENCOUNTER — Encounter: Payer: Self-pay | Admitting: Hematology and Oncology

## 2016-08-20 LAB — HEPATITIS B CORE ANTIBODY, TOTAL

## 2016-09-02 ENCOUNTER — Encounter: Payer: Self-pay | Admitting: Hematology and Oncology

## 2016-11-07 ENCOUNTER — Inpatient Hospital Stay: Payer: Medicare Other

## 2016-11-07 ENCOUNTER — Inpatient Hospital Stay: Payer: Medicare Other | Attending: Hematology and Oncology | Admitting: Hematology and Oncology

## 2016-11-07 ENCOUNTER — Other Ambulatory Visit: Payer: Self-pay | Admitting: *Deleted

## 2016-11-07 ENCOUNTER — Encounter: Payer: Self-pay | Admitting: Hematology and Oncology

## 2016-11-07 VITALS — BP 125/82 | HR 76 | Temp 97.2°F | Ht 62.0 in | Wt 210.8 lb

## 2016-11-07 DIAGNOSIS — C8218 Follicular lymphoma grade II, lymph nodes of multiple sites: Secondary | ICD-10-CM

## 2016-11-07 DIAGNOSIS — I10 Essential (primary) hypertension: Secondary | ICD-10-CM | POA: Insufficient documentation

## 2016-11-07 DIAGNOSIS — C8210 Follicular lymphoma grade II, unspecified site: Secondary | ICD-10-CM

## 2016-11-07 LAB — CBC WITH DIFFERENTIAL/PLATELET
Basophils Absolute: 0 10*3/uL (ref 0–0.1)
Basophils Relative: 1 %
Eosinophils Absolute: 0.2 10*3/uL (ref 0–0.7)
Eosinophils Relative: 4 %
HCT: 40 % (ref 35.0–47.0)
Hemoglobin: 13.5 g/dL (ref 12.0–16.0)
Lymphocytes Relative: 27 %
Lymphs Abs: 1.5 10*3/uL (ref 1.0–3.6)
MCH: 29.3 pg (ref 26.0–34.0)
MCHC: 33.8 g/dL (ref 32.0–36.0)
MCV: 86.7 fL (ref 80.0–100.0)
Monocytes Absolute: 0.3 10*3/uL (ref 0.2–0.9)
Monocytes Relative: 6 %
Neutro Abs: 3.5 10*3/uL (ref 1.4–6.5)
Neutrophils Relative %: 62 %
Platelets: 271 10*3/uL (ref 150–440)
RBC: 4.61 MIL/uL (ref 3.80–5.20)
RDW: 14.5 % (ref 11.5–14.5)
WBC: 5.6 10*3/uL (ref 3.6–11.0)

## 2016-11-07 LAB — COMPREHENSIVE METABOLIC PANEL
ALT: 27 U/L (ref 14–54)
AST: 33 U/L (ref 15–41)
Albumin: 4.5 g/dL (ref 3.5–5.0)
Alkaline Phosphatase: 102 U/L (ref 38–126)
Anion gap: 8 (ref 5–15)
BUN: 21 mg/dL — ABNORMAL HIGH (ref 6–20)
CO2: 31 mmol/L (ref 22–32)
Calcium: 9.2 mg/dL (ref 8.9–10.3)
Chloride: 99 mmol/L — ABNORMAL LOW (ref 101–111)
Creatinine, Ser: 0.83 mg/dL (ref 0.44–1.00)
GFR calc Af Amer: >60 mL/min (ref >60)
GFR calc non Af Amer: >60 mL/min (ref >60)
Glucose, Bld: 129 mg/dL — ABNORMAL HIGH (ref 65–99)
Potassium: 3.6 mmol/L (ref 3.5–5.1)
Sodium: 138 mmol/L (ref 135–145)
Total Bilirubin: 0.5 mg/dL (ref 0.3–1.2)
Total Protein: 7.6 g/dL (ref 6.5–8.1)

## 2016-11-07 LAB — LACTATE DEHYDROGENASE: LDH: 101 U/L (ref 98–192)

## 2016-11-07 LAB — URIC ACID: Uric Acid, Serum: 6.9 mg/dL — ABNORMAL HIGH (ref 2.3–6.6)

## 2016-11-07 NOTE — Progress Notes (Signed)
Patient here for follow up. She has a new diagnosis of heart murmer.

## 2016-11-07 NOTE — Progress Notes (Signed)
[place] [facility] day: . 11/07/2016   Chief Complaint: Alexandra Moore is a 66 y.o. female with follicular lymphoma who is seen for 3 month assessment.  HPI:   The patient was last seen in the medical oncology clinic on 08/15/2016.   At that time, she felt great.  She denied any B symptoms.  Lymph nodes appeared to be waxing and waning.  PET scan revealed hypermetabolic adenopathy in the neck, chest, abdomen, and pelvis, compatible with lymphoma.  Largest lymph node was a 2.7 cm right external iliac node.  Other nodes measured 1.4 - 1.8 cm.  We discussed plans for close surveillance.  During the interm, she has done well.  She denies any B symptoms.  She is working in the garden.  She retired, but is now temporarily out of retirement.  She is working week to week at [employer].  She starts next week.   Past Medical History:  Diagnosis Date  . Hypertension     History reviewed. No pertinent surgical history.  Family History  Problem Relation Age of Onset  . Breast cancer Neg Hx     Social History:  reports that she has never smoked. She has never used smokeless tobacco. She reports that she drinks alcohol. She reports that she does not use drugs.  She drinks wine once a month.  She denies any exposure to radiation or toxins.  She retired in 10/2015.  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  She previously lived in [place].  She moved to [place] in 1993.  She lives in [place].  The patient is alone today.  Allergies:  Allergies  Allergen Reactions  . Sulfa Antibiotics Hives    Current Medications: Current Outpatient Prescriptions  Medication Sig Dispense Refill  . amLODipine (NORVASC) 5 MG tablet Take 5 mg by mouth daily.    . Cholecalciferol (VITAMIN D3) 1000 units CAPS Take 1,000 Units by mouth daily.    . Flaxseed Oil OIL Use once daily.    . Garlic Oil 2 MG CAPS Take 1,000 mg by mouth  daily.    Marland Kitchen GLUCOSAMINE SULFATE PO Take 1 tablet by mouth daily.    . magnesium gluconate (MAGONATE) 500 MG tablet Take 500 mg by mouth 2 (two) times daily.    . Multiple Vitamin (MULTI-VITAMINS) TABS Take 1 tablet by mouth daily.    . Oil of Oregano 1500 MG CAPS Take 1,500 mg by mouth daily.    . Probiotic Product (PROBIOTIC-10) CAPS Take 70 mg by mouth daily.    . TURMERIC PO Take 400 mg by mouth daily.    . vitamin E 400 UNIT capsule Take 400 Units by mouth daily.     No current facility-administered medications for this visit.     Review of Systems:  GENERAL:  Feels good.  No fevers or sweats.  Weight loss of 2 pounds. PERFORMANCE STATUS (ECOG):  0 HEENT:  No visual changes, runny nose, sore throat, mouth sores or tenderness. Lungs: No shortness of breath.  Rare cough.  No hemoptysis. Cardiac:  No chest pain, palpitations, orthopnea, or PND. GI:  No nausea, vomiting, diarrhea, constipation, melena or hematochezia.  No prior colonoscopy (refused, feels too invasive). GU:  No urgency, frequency, dysuria, or hematuria. Musculoskeletal:  No back pain.  Knee pain, sometimes.  No muscle tenderness. Extremities:  No pain or swelling. Skin:  No rashes or skin  changes. Neuro:  No headache, numbness or weakness, balance or coordination issues. Endocrine:  No diabetes, thyroid issues, hot flashes or night sweats. Psych:  No mood changes, depression or anxiety. Pain:  No focal pain. Review of systems:  All other systems reviewed and found to be negative.  Physical Exam: Blood pressure 125/82, pulse 76, temperature (!) 97.2 F (36.2 C), temperature source Tympanic, height '5\' 2"'  (1.575 m), weight 210 lb 12.2 oz (95.6 kg). GENERAL:  Well developed, well nourished, woman sitting comfortably in the exam room in no acute distress. MENTAL STATUS:  Alert and oriented to person, place and time. HEAD:  Curly brown hair.  Normocephalic, atraumatic, face symmetric, no Cushingoid features. EYES:   Glasses.  Blue eyes.  Pupils equal round and reactive to light and accomodation.  No conjunctivitis or scleral icterus. ENT:  Oropharynx clear without lesion.  Tongue normal. Mucous membranes moist.  RESPIRATORY:  Clear to auscultation without rales, wheezes or rhonchi. CARDIOVASCULAR:  Regular rate and rhythm without murmur, rub or gallop. ABDOMEN:  Soft, non-tender, with active bowel sounds, and no appreciable hepatosplenomegaly.  No masses. SKIN:  No rashes, ulcers or lesions. EXTREMITIES: No edema, no skin discoloration or tenderness.  No palpable cords. LYMPH NODES: Low anterior cervical 2 cm node.  High left neck node.  Small axillary adenopathy (right> left).  Shotty right inguinal node.  NEUROLOGICAL: Unremarkable. PSYCH:  Appropriate.   Appointment on 11/07/2016  Component Date Value Ref Range Status  . WBC 11/07/2016 5.6  3.6 - 11.0 K/uL Final  . RBC 11/07/2016 4.61  3.80 - 5.20 MIL/uL Final  . Hemoglobin 11/07/2016 13.5  12.0 - 16.0 g/dL Final  . HCT 11/07/2016 40.0  35.0 - 47.0 % Final  . MCV 11/07/2016 86.7  80.0 - 100.0 fL Final  . MCH 11/07/2016 29.3  26.0 - 34.0 pg Final  . MCHC 11/07/2016 33.8  32.0 - 36.0 g/dL Final  . RDW 11/07/2016 14.5  11.5 - 14.5 % Final  . Platelets 11/07/2016 271  150 - 440 K/uL Final  . Neutrophils Relative % 11/07/2016 62  % Final  . Neutro Abs 11/07/2016 3.5  1.4 - 6.5 K/uL Final  . Lymphocytes Relative 11/07/2016 27  % Final  . Lymphs Abs 11/07/2016 1.5  1.0 - 3.6 K/uL Final  . Monocytes Relative 11/07/2016 6  % Final  . Monocytes Absolute 11/07/2016 0.3  0.2 - 0.9 K/uL Final  . Eosinophils Relative 11/07/2016 4  % Final  . Eosinophils Absolute 11/07/2016 0.2  0 - 0.7 K/uL Final  . Basophils Relative 11/07/2016 1  % Final  . Basophils Absolute 11/07/2016 0.0  0 - 0.1 K/uL Final  . Sodium 11/07/2016 138  135 - 145 mmol/L Final  . Potassium 11/07/2016 3.6  3.5 - 5.1 mmol/L Final  . Chloride 11/07/2016 99* 101 - 111 mmol/L Final  . CO2  11/07/2016 31  22 - 32 mmol/L Final  . Glucose, Bld 11/07/2016 129* 65 - 99 mg/dL Final  . BUN 11/07/2016 21* 6 - 20 mg/dL Final  . Creatinine, Ser 11/07/2016 0.83  0.44 - 1.00 mg/dL Final  . Calcium 11/07/2016 9.2  8.9 - 10.3 mg/dL Final  . Total Protein 11/07/2016 7.6  6.5 - 8.1 g/dL Final  . Albumin 11/07/2016 4.5  3.5 - 5.0 g/dL Final  . AST 11/07/2016 33  15 - 41 U/L Final  . ALT 11/07/2016 27  14 - 54 U/L Final  . Alkaline Phosphatase 11/07/2016 102  38 - 126  U/L Final  . Total Bilirubin 11/07/2016 0.5  0.3 - 1.2 mg/dL Final  . GFR calc non Af Amer 11/07/2016 >60  >60 mL/min Final  . GFR calc Af Amer 11/07/2016 >60  >60 mL/min Final   Comment: (NOTE) The eGFR has been calculated using the CKD EPI equation. This calculation has not been validated in all clinical situations. eGFR's persistently <60 mL/min signify possible Chronic Kidney Disease.   . Anion gap 11/07/2016 8  5 - 15 Final  . LDH 11/07/2016 101  98 - 192 U/L Final  . Uric Acid, Serum 11/07/2016 6.9* 2.3 - 6.6 mg/dL Final    Assessment:  Alexandra Moore is a 66 y.o. 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  She initially developed right axillary adenopathy in 10/2015.  She then developed waxing and waning left neck adenopathy.  Ultrasound guided biopsy of a left supraclavicular node on 07/02/2016 revealed grade I-II follicular lymphoma.  Chest CT on 06/18/2016 revealed left supraclavicular and bilateral axillary adenopathy.  The largest left axillary node measured 2 cm.  The largest right axillary node measured 3.5 x 2.2 cm.  There was no mediastinal or hilar adenopathy.  There was a calcified right hilar lymph nodes likely secondary to prior granulomatous disease. There was a 9 mm calcified granuloma in right lower lobe laterally.  PET scan on 07/24/2016 revealed hypermetabolic adenopathy in the neck, chest, abdomen, and pelvis, compatible with lymphoma.  Index nodes included a level IIa neck node of 1.8 cm (SUV 9.7), left  axillary node 1.8 cm (SUV 6.9), AP window node 1.4 cm (SUV 6.9), 1.4 cm left periaortic lymph node (SUV 7.9), 2.7 cm right external iliac node (SUV 9.2), and right inguinal node (SUV 8.9).  There was mild marrow heterogeneity of activity but without a well-defined region of osseous malignant involvement.   Bone marrow aspirate and biopsy on  07/31/2016 revealed a slightly hypercellular marrow for age with atypical lymphoid aggregates and trilineage hematopoiesis.  Flow cytometry revealed a minor B cell population with lambda light chain excess representing < 10% of the lymphocytes with expression of pan B-cell antigens including CD20.  These findings are worrisome but not definitive for minimal involvement by a B-cell lymphoproliferative process.  Overall findings are consistent with involvment by non-Hodgkin's lymphoma.  Symptomatically, she denies any B symptoms.  She has palpable adenopathy in the left neck and axillae (stable).  Plan: 1.  Labs today:  CBC with diff, CMP, uric acid, LDH. 2.  Discuss continued observation.  She does not have any B symptoms. She has stage IVa disease. Discuss indications for systemic treatment including B symptoms, bulky or disfiguring adenopathy, rapidly progressive disease, threatened organ function, cytopenias, or  autoimmune disease.  Lymph nodes appear to be waxing and waning.  Patient feels comfortable with close observation.   3.  RTC in 4 months for MD assessment and labs (CBC with diff, CMP, uric acid, LDH).   Lequita Asal, MD  11/07/2016, 2:57 PM

## 2016-11-14 ENCOUNTER — Ambulatory Visit: Payer: Medicare Other | Admitting: Hematology and Oncology

## 2016-11-14 ENCOUNTER — Other Ambulatory Visit: Payer: Medicare Other

## 2016-11-21 ENCOUNTER — Other Ambulatory Visit: Payer: Medicare Other

## 2016-11-21 ENCOUNTER — Ambulatory Visit: Payer: Medicare Other | Admitting: Hematology and Oncology

## 2017-03-06 ENCOUNTER — Inpatient Hospital Stay: Payer: Medicare Other | Attending: Hematology and Oncology | Admitting: Hematology and Oncology

## 2017-03-06 ENCOUNTER — Other Ambulatory Visit: Payer: Self-pay | Admitting: *Deleted

## 2017-03-06 ENCOUNTER — Encounter: Payer: Self-pay | Admitting: Hematology and Oncology

## 2017-03-06 ENCOUNTER — Inpatient Hospital Stay: Payer: Medicare Other

## 2017-03-06 VITALS — BP 151/84 | HR 78 | Temp 98.4°F | Resp 20 | Wt 216.7 lb

## 2017-03-06 DIAGNOSIS — E790 Hyperuricemia without signs of inflammatory arthritis and tophaceous disease: Secondary | ICD-10-CM

## 2017-03-06 DIAGNOSIS — C8218 Follicular lymphoma grade II, lymph nodes of multiple sites: Secondary | ICD-10-CM

## 2017-03-06 DIAGNOSIS — E876 Hypokalemia: Secondary | ICD-10-CM

## 2017-03-06 DIAGNOSIS — I10 Essential (primary) hypertension: Secondary | ICD-10-CM | POA: Insufficient documentation

## 2017-03-06 DIAGNOSIS — Z79899 Other long term (current) drug therapy: Secondary | ICD-10-CM | POA: Diagnosis not present

## 2017-03-06 DIAGNOSIS — C8210 Follicular lymphoma grade II, unspecified site: Secondary | ICD-10-CM

## 2017-03-06 LAB — COMPREHENSIVE METABOLIC PANEL
ALT: 34 U/L (ref 14–54)
AST: 42 U/L — ABNORMAL HIGH (ref 15–41)
Albumin: 4.4 g/dL (ref 3.5–5.0)
Alkaline Phosphatase: 104 U/L (ref 38–126)
Anion gap: 10 (ref 5–15)
BUN: 19 mg/dL (ref 6–20)
CO2: 30 mmol/L (ref 22–32)
Calcium: 9.7 mg/dL (ref 8.9–10.3)
Chloride: 101 mmol/L (ref 101–111)
Creatinine, Ser: 0.82 mg/dL (ref 0.44–1.00)
GFR calc Af Amer: >60 mL/min (ref >60)
GFR calc non Af Amer: >60 mL/min (ref >60)
Glucose, Bld: 135 mg/dL — ABNORMAL HIGH (ref 65–99)
Potassium: 3.2 mmol/L — ABNORMAL LOW (ref 3.5–5.1)
Sodium: 141 mmol/L (ref 135–145)
Total Bilirubin: 0.6 mg/dL (ref 0.3–1.2)
Total Protein: 7.2 g/dL (ref 6.5–8.1)

## 2017-03-06 LAB — CBC WITH DIFFERENTIAL/PLATELET
Basophils Absolute: 0 10*3/uL (ref 0–0.1)
Basophils Relative: 1 %
Eosinophils Absolute: 0.2 10*3/uL (ref 0–0.7)
Eosinophils Relative: 3 %
HCT: 39.7 % (ref 35.0–47.0)
Hemoglobin: 13.3 g/dL (ref 12.0–16.0)
Lymphocytes Relative: 22 %
Lymphs Abs: 1.2 10*3/uL (ref 1.0–3.6)
MCH: 29.1 pg (ref 26.0–34.0)
MCHC: 33.6 g/dL (ref 32.0–36.0)
MCV: 86.7 fL (ref 80.0–100.0)
Monocytes Absolute: 0.3 10*3/uL (ref 0.2–0.9)
Monocytes Relative: 5 %
Neutro Abs: 3.6 10*3/uL (ref 1.4–6.5)
Neutrophils Relative %: 69 %
Platelets: 250 10*3/uL (ref 150–440)
RBC: 4.58 MIL/uL (ref 3.80–5.20)
RDW: 14.5 % (ref 11.5–14.5)
WBC: 5.2 10*3/uL (ref 3.6–11.0)

## 2017-03-06 LAB — URIC ACID: Uric Acid, Serum: 7 mg/dL — ABNORMAL HIGH (ref 2.3–6.6)

## 2017-03-06 LAB — LACTATE DEHYDROGENASE: LDH: 113 U/L (ref 98–192)

## 2017-03-06 NOTE — Progress Notes (Signed)
[facility] day: . 03/06/2017   Chief Complaint: Alexandra Moore is a 66 y.o. female with follicular lymphoma who is seen for 4 month assessment.  HPI:   The patient was last seen in the medical oncology clinic on 11/07/2016.   At that time, she denied any B symptoms.  She had stable small palpable adenopathy in the left neck and axillae.  During the interm, she has felt fine.    She notes some intermittent "aches and pains".  She notes no change in her palpable lymph nodes.  She denies any B symptoms.  Weight is up 6 pounds.   Past Medical History:  Diagnosis Date  . Hypertension     History reviewed. No pertinent surgical history.  Family History  Problem Relation Age of Onset  . Breast cancer Neg Hx     Social History:  reports that  has never smoked. she has never used smokeless tobacco. She reports that she drinks alcohol. She reports that she does not use drugs.  She drinks wine once a month.  She denies any exposure to radiation or toxins.  She retired in 10/2015.  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  She previously lived in [place].  She moved to [place] in 1993.  She lives in [place].  Her husband is receiving chemotherapy.  The patient is alone today.  Allergies:  Allergies  Allergen Reactions  . Sulfa Antibiotics Hives    Current Medications: Current Outpatient Medications  Medication Sig Dispense Refill  . amLODipine (NORVASC) 5 MG tablet Take 5 mg by mouth daily.    . Cholecalciferol (VITAMIN D3) 1000 units CAPS Take 1,000 Units by mouth daily.    Marland Kitchen ELDERBERRY PO Take 1 capsule daily by mouth.    . Flaxseed Oil OIL Use once daily.    . Garlic Oil 2 MG CAPS Take 1,000 mg by mouth daily.    Marland Kitchen GLUCOSAMINE SULFATE PO Take 1 tablet by mouth daily.    . IRON PO Take 25 mg daily by mouth.    . magnesium gluconate (MAGONATE) 500 MG tablet Take 500 mg by mouth 2 (two) times  daily.    . Multiple Vitamin (MULTI-VITAMINS) TABS Take 1 tablet by mouth daily.    . Oil of Oregano 1500 MG CAPS Take 1,500 mg by mouth daily.    . Probiotic Product (PROBIOTIC-10) CAPS Take 70 mg by mouth daily.    . TURMERIC PO Take 400 mg by mouth daily.    . vitamin E 400 UNIT capsule Take 400 Units by mouth daily.     No current facility-administered medications for this visit.     Review of Systems:  GENERAL:  Feels good.  No fevers or sweats.  Weight gain of 6 pounds. PERFORMANCE STATUS (ECOG):  0 HEENT:  No visual changes, runny nose, sore throat, mouth sores or tenderness. Lungs: No shortness of breath.  Rare cough.  No hemoptysis. Cardiac:  No chest pain, palpitations, orthopnea, or PND. GI:  No nausea, vomiting, diarrhea, constipation, melena or hematochezia.  No prior colonoscopy (refused, feels too invasive). GU:  No urgency, frequency, dysuria, or hematuria. Musculoskeletal:  Some "aches and pains".  No back pain.  Knee pain, sometimes.  No muscle tenderness. Extremities:  No pain or swelling. Skin:  No rashes or skin changes. Neuro:  No headache, numbness or weakness, balance or coordination issues. Endocrine:  No  diabetes, thyroid issues, hot flashes or night sweats. Psych:  No mood changes, depression or anxiety. Pain:  No focal pain. Review of systems:  All other systems reviewed and found to be negative.  Physical Exam: Blood pressure (!) 151/84, pulse 78, temperature 98.4 F (36.9 C), temperature source Tympanic, resp. rate 20, weight 216 lb 11.4 oz (98.3 kg). GENERAL:  Well developed, well nourished, woman sitting comfortably in the exam room in no acute distress. MENTAL STATUS:  Alert and oriented to person, place and time. HEAD:  Curly dark hair.  Normocephalic, atraumatic, face symmetric, no Cushingoid features. EYES:  Glasses.  Blue eyes.  Pupils equal round and reactive to light and accomodation.  No conjunctivitis or scleral icterus. ENT:  Oropharynx  clear without lesion.  Tongue normal. Mucous membranes moist.  RESPIRATORY:  Clear to auscultation without rales, wheezes or rhonchi. CARDIOVASCULAR:  Regular rate and rhythm without murmur, rub or gallop. ABDOMEN:  Soft, non-tender, with active bowel sounds, and no appreciable hepatosplenomegaly.  No masses. SKIN:  No rashes, ulcers or lesions. EXTREMITIES: No edema, no skin discoloration or tenderness.  No palpable cords. LYMPH NODES: Low left anterior cervical 2.5 cm node.  High left posterior 1.5 cm neck node.  Small 2-2.5 cm axillary adenopathy (right> left).  Shotty right inguinal node.  NEUROLOGICAL: Unremarkable. PSYCH:  Appropriate.   Appointment on 03/06/2017  Component Date Value Ref Range Status  . Uric Acid, Serum 03/06/2017 7.0* 2.3 - 6.6 mg/dL Final  . LDH 03/06/2017 113  98 - 192 U/L Final  . Sodium 03/06/2017 141  135 - 145 mmol/L Final  . Potassium 03/06/2017 3.2* 3.5 - 5.1 mmol/L Final  . Chloride 03/06/2017 101  101 - 111 mmol/L Final  . CO2 03/06/2017 30  22 - 32 mmol/L Final  . Glucose, Bld 03/06/2017 135* 65 - 99 mg/dL Final  . BUN 03/06/2017 19  6 - 20 mg/dL Final  . Creatinine, Ser 03/06/2017 0.82  0.44 - 1.00 mg/dL Final  . Calcium 03/06/2017 9.7  8.9 - 10.3 mg/dL Final  . Total Protein 03/06/2017 7.2  6.5 - 8.1 g/dL Final  . Albumin 03/06/2017 4.4  3.5 - 5.0 g/dL Final  . AST 03/06/2017 42* 15 - 41 U/L Final  . ALT 03/06/2017 34  14 - 54 U/L Final  . Alkaline Phosphatase 03/06/2017 104  38 - 126 U/L Final  . Total Bilirubin 03/06/2017 0.6  0.3 - 1.2 mg/dL Final  . GFR calc non Af Amer 03/06/2017 >60  >60 mL/min Final  . GFR calc Af Amer 03/06/2017 >60  >60 mL/min Final   Comment: (NOTE) The eGFR has been calculated using the CKD EPI equation. This calculation has not been validated in all clinical situations. eGFR's persistently <60 mL/min signify possible Chronic Kidney Disease.   . Anion gap 03/06/2017 10  5 - 15 Final  . WBC 03/06/2017 5.2  3.6 -  11.0 K/uL Final  . RBC 03/06/2017 4.58  3.80 - 5.20 MIL/uL Final  . Hemoglobin 03/06/2017 13.3  12.0 - 16.0 g/dL Final  . HCT 03/06/2017 39.7  35.0 - 47.0 % Final  . MCV 03/06/2017 86.7  80.0 - 100.0 fL Final  . MCH 03/06/2017 29.1  26.0 - 34.0 pg Final  . MCHC 03/06/2017 33.6  32.0 - 36.0 g/dL Final  . RDW 03/06/2017 14.5  11.5 - 14.5 % Final  . Platelets 03/06/2017 250  150 - 440 K/uL Final  . Neutrophils Relative % 03/06/2017 69  % Final  .  Neutro Abs 03/06/2017 3.6  1.4 - 6.5 K/uL Final  . Lymphocytes Relative 03/06/2017 22  % Final  . Lymphs Abs 03/06/2017 1.2  1.0 - 3.6 K/uL Final  . Monocytes Relative 03/06/2017 5  % Final  . Monocytes Absolute 03/06/2017 0.3  0.2 - 0.9 K/uL Final  . Eosinophils Relative 03/06/2017 3  % Final  . Eosinophils Absolute 03/06/2017 0.2  0 - 0.7 K/uL Final  . Basophils Relative 03/06/2017 1  % Final  . Basophils Absolute 03/06/2017 0.0  0 - 0.1 K/uL Final    Assessment:  Alexandra Moore is a 66 y.o. 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  She initially developed right axillary adenopathy in 10/2015.  She then developed waxing and waning left neck adenopathy.  Ultrasound guided biopsy of a left supraclavicular node on 07/02/2016 revealed grade I-II follicular lymphoma.  Chest CT on 06/18/2016 revealed left supraclavicular and bilateral axillary adenopathy.  The largest left axillary node measured 2 cm.  The largest right axillary node measured 3.5 x 2.2 cm.  There was no mediastinal or hilar adenopathy.  There was a calcified right hilar lymph nodes likely secondary to prior granulomatous disease. There was a 9 mm calcified granuloma in right lower lobe laterally.  PET scan on 07/24/2016 revealed hypermetabolic adenopathy in the neck, chest, abdomen, and pelvis, compatible with lymphoma.  Index nodes included a level IIa neck node of 1.8 cm (SUV 9.7), left axillary node 1.8 cm (SUV 6.9), AP window node 1.4 cm (SUV 6.9), 1.4 cm left periaortic lymph node  (SUV 7.9), 2.7 cm right external iliac node (SUV 9.2), and right inguinal node (SUV 8.9).  There was mild marrow heterogeneity of activity but without a well-defined region of osseous malignant involvement.   Bone marrow aspirate and biopsy on  07/31/2016 revealed a slightly hypercellular marrow for age with atypical lymphoid aggregates and trilineage hematopoiesis.  Flow cytometry revealed a minor B cell population with lambda light chain excess representing < 10% of the lymphocytes with expression of pan B-cell antigens including CD20.  These findings are worrisome but not definitive for minimal involvement by a B-cell lymphoproliferative process.  Overall findings are consistent with involvment by non-Hodgkin's lymphoma.  Symptomatically, she denies any B symptoms.  She has palpable adenopathy in the left neck and axillae (stable).  Uric acid is 7.0 (high).  Potassium is 3.2 (low).  Plan: 1.  Labs today:  CBC with diff, CMP, uric acid, LDH. 2.  Discuss elevated uric acid.  Patient declines allopurinol. 3.  Discuss potassium.  Patient wishes to try dietary modification. 4.  Discuss continued observation.  She does not have any B symptoms. She has stage IVa disease. Discuss indications for systemic treatment including B symptoms, bulky or disfiguring adenopathy, rapidly progressive disease, threatened organ function, cytopenias, or  autoimmune disease.  Lymph nodes are stable.  Patient wishes close observation.   5.  RTC in 4 months for MD assessment and labs (CBC with diff, CMP, uric acid, LDH).   Lequita Asal, MD  03/06/2017, 4:35 PM

## 2017-03-06 NOTE — Progress Notes (Signed)
Patient here today for follow up today regarding follicular lymphoma.  Offers no complaints today.

## 2017-03-13 DIAGNOSIS — E790 Hyperuricemia without signs of inflammatory arthritis and tophaceous disease: Secondary | ICD-10-CM | POA: Insufficient documentation

## 2017-03-17 ENCOUNTER — Encounter: Payer: Self-pay | Admitting: Hematology and Oncology

## 2017-07-03 ENCOUNTER — Inpatient Hospital Stay: Payer: Medicare Other

## 2017-07-03 ENCOUNTER — Inpatient Hospital Stay: Payer: Medicare Other | Attending: Hematology and Oncology | Admitting: Urgent Care

## 2017-07-03 VITALS — BP 151/85 | HR 68 | Temp 96.6°F | Resp 20 | Wt 210.1 lb

## 2017-07-03 DIAGNOSIS — E876 Hypokalemia: Secondary | ICD-10-CM | POA: Insufficient documentation

## 2017-07-03 DIAGNOSIS — C8218 Follicular lymphoma grade II, lymph nodes of multiple sites: Secondary | ICD-10-CM | POA: Insufficient documentation

## 2017-07-03 DIAGNOSIS — R590 Localized enlarged lymph nodes: Secondary | ICD-10-CM

## 2017-07-03 DIAGNOSIS — E790 Hyperuricemia without signs of inflammatory arthritis and tophaceous disease: Secondary | ICD-10-CM | POA: Insufficient documentation

## 2017-07-03 LAB — CBC WITH DIFFERENTIAL/PLATELET
Basophils Absolute: 0 10*3/uL (ref 0–0.1)
Basophils Relative: 1 %
Eosinophils Absolute: 0.1 10*3/uL (ref 0–0.7)
Eosinophils Relative: 3 %
HCT: 42.5 % (ref 35.0–47.0)
Hemoglobin: 14.3 g/dL (ref 12.0–16.0)
Lymphocytes Relative: 29 %
Lymphs Abs: 1.4 10*3/uL (ref 1.0–3.6)
MCH: 29.2 pg (ref 26.0–34.0)
MCHC: 33.5 g/dL (ref 32.0–36.0)
MCV: 87.1 fL (ref 80.0–100.0)
Monocytes Absolute: 0.3 10*3/uL (ref 0.2–0.9)
Monocytes Relative: 7 %
Neutro Abs: 2.9 10*3/uL (ref 1.4–6.5)
Neutrophils Relative %: 60 %
Platelets: 236 10*3/uL (ref 150–440)
RBC: 4.88 MIL/uL (ref 3.80–5.20)
RDW: 14.7 % — ABNORMAL HIGH (ref 11.5–14.5)
WBC: 4.8 10*3/uL (ref 3.6–11.0)

## 2017-07-03 LAB — COMPREHENSIVE METABOLIC PANEL
ALT: 32 U/L (ref 14–54)
AST: 36 U/L (ref 15–41)
Albumin: 4.5 g/dL (ref 3.5–5.0)
Alkaline Phosphatase: 87 U/L (ref 38–126)
Anion gap: 11 (ref 5–15)
BUN: 17 mg/dL (ref 6–20)
CO2: 28 mmol/L (ref 22–32)
Calcium: 9.1 mg/dL (ref 8.9–10.3)
Chloride: 97 mmol/L — ABNORMAL LOW (ref 101–111)
Creatinine, Ser: 0.85 mg/dL (ref 0.44–1.00)
GFR calc Af Amer: >60 mL/min (ref >60)
GFR calc non Af Amer: >60 mL/min (ref >60)
Glucose, Bld: 88 mg/dL (ref 65–99)
Potassium: 3.1 mmol/L — ABNORMAL LOW (ref 3.5–5.1)
Sodium: 136 mmol/L (ref 135–145)
Total Bilirubin: 0.7 mg/dL (ref 0.3–1.2)
Total Protein: 7.9 g/dL (ref 6.5–8.1)

## 2017-07-03 LAB — LACTATE DEHYDROGENASE: LDH: 111 U/L (ref 98–192)

## 2017-07-03 LAB — URIC ACID: Uric Acid, Serum: 7.1 mg/dL — ABNORMAL HIGH (ref 2.3–6.6)

## 2017-07-03 NOTE — Progress Notes (Signed)
Patient offers no complaints today. 

## 2017-07-03 NOTE — Progress Notes (Signed)
[facility] day: . 03/06/2017   Chief Complaint: Alexandra Moore is a 67 y.o. female with follicular lymphoma who is seen for 3 month assessment.  HPI:   The patient was last seen in the medical oncology clinic on 03/06/2017.   At that time, patient was doing well. She complained of intermittent "aches and pains".  Exam revealed stable palpable adenopathy in the left neck and axillae. Potassium low at 3.2; elected to treat with increased dietary intake. Uric acid elevated at 7.0; refused allopurinol.   Mammogram due. Patient advising that GYN Glenis Smoker) orders her mammograms. Patient is scheduled appointment set up to see provider soon.   Symptomatically, patient is doing well overall. She continues to have arthritic pains in her her knees. She is seeing a Agricultural engineer for acupuncture therapy. Patient is more active. She exercises 4x/week; walking and biking. Patient notes that she fatigues easily over the last year. She states, "I can do it, but I just have to pace myself".   Patient denies fevers, sweats, and significant weight loss. Patient denies bleeding; no hematochezia, melena, or gross hematuria. She has not appreciated any new areas of adenopathy. She denies recurrent infections. She makes mention of a "cold" earlier this year that cleared without treatment, and that had no associated fevers.   Patient eating well. Her weight is down 6 pounds, however this is intentional. Patient denies pain in the clinic today.   Past Medical History:  Diagnosis Date  . Hypertension     No past surgical history on file.  Family History  Problem Relation Age of Onset  . Breast cancer Neg Hx     Social History:  reports that  has never smoked. she has never used smokeless tobacco. She reports that she drinks alcohol. She reports that she does not use drugs.  She drinks wine once a month.  She denies any exposure to radiation or toxins.  She  retired in 10/2015.  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  She previously lived in [place].  She moved to [place] in 1993.  She lives in [place].  Her husband is receiving chemotherapy.  The patient is alone today.  Allergies:  Allergies  Allergen Reactions  . Sulfa Antibiotics Hives    Current Medications: Current Outpatient Medications  Medication Sig Dispense Refill  . amLODipine (NORVASC) 5 MG tablet Take 5 mg by mouth daily.    . Cholecalciferol (VITAMIN D3) 1000 units CAPS Take 1,000 Units by mouth daily.    Marland Kitchen ELDERBERRY PO Take 1 capsule daily by mouth.    . Flaxseed Oil OIL Use once daily.    . Garlic Oil 2 MG CAPS Take 1,000 mg by mouth daily.    Marland Kitchen GLUCOSAMINE SULFATE PO Take 1 tablet by mouth daily.    . IRON PO Take 25 mg daily by mouth.    . magnesium gluconate (MAGONATE) 500 MG tablet Take 500 mg by mouth 2 (two) times daily.    . Misc Natural Products (BLACK CHERRY CONCENTRATE PO) Take 750 mg by mouth daily.    . Multiple Vitamin (MULTI-VITAMINS) TABS Take 1 tablet by mouth daily.    . Oil of Oregano 1500 MG CAPS Take 1,500 mg by mouth daily.    . Probiotic Product (PROBIOTIC-10) CAPS Take 70 mg by mouth daily.    . TURMERIC PO Take 400 mg by mouth daily.    Marland Kitchen  vitamin E 400 UNIT capsule Take 400 Units by mouth daily.     No current facility-administered medications for this visit.     Review of Systems:  GENERAL:  Feels "ok". Fatigues easily.  No fevers or sweats.  Weight down 6 pounds. PERFORMANCE STATUS (ECOG):  0 HEENT:  No visual changes, runny nose, sore throat, mouth sores or tenderness. Lungs: No shortness of breath. No cough.  No hemoptysis. Cardiac:  No chest pain, palpitations, orthopnea, or PND. GI:  No nausea, vomiting, diarrhea, constipation, melena or hematochezia.  No prior colonoscopy (refused, feels too invasive). GU:  No urgency, frequency, dysuria, or hematuria. Musculoskeletal:  No back pain.   Bilateral knee pain  No muscle tenderness. Extremities:  No pain or swelling. Skin:  No rashes or skin changes. Neuro:  No headache, numbness or weakness, balance or coordination issues. Endocrine:  No diabetes, thyroid issues, hot flashes or night sweats. Psych:  No mood changes, depression or anxiety. Pain:  No focal pain. Review of systems:  All other systems reviewed and found to be negative.  Physical Exam: Blood pressure (!) 151/85, pulse 68, temperature (!) 96.6 F (35.9 C), resp. rate 20, weight 210 lb 2 oz (95.3 kg). GENERAL:  Well developed, well nourished, woman sitting comfortably in the exam room in no acute distress. MENTAL STATUS:  Alert and oriented to person, place and time. HEAD:  Curly dark hair.  Normocephalic, atraumatic, face symmetric, no Cushingoid features. EYES:  Glasses.  Blue eyes.  Pupils equal round and reactive to light and accomodation.  No conjunctivitis or scleral icterus. ENT:  Oropharynx clear without lesion.  Tongue normal. Mucous membranes moist.  RESPIRATORY:  Clear to auscultation without rales, wheezes or rhonchi. CARDIOVASCULAR:  Regular rate and rhythm without murmur, rub or gallop. ABDOMEN:  Soft, non-tender, with active bowel sounds, and no appreciable hepatosplenomegaly.  No masses. SKIN:  No rashes, ulcers or lesions. EXTREMITIES: No edema, no skin discoloration or tenderness.  No palpable cords. LYMPH NODES: Low left anterior cervical 2.5 cm node.  High left posterior 1.5 cm neck node.  Small 2-2.5 cm axillary adenopathy (right> left).  Shotty right inguinal node.  NEUROLOGICAL: Unremarkable. PSYCH:  Appropriate.   Appointment on 07/03/2017  Component Date Value Ref Range Status  . Uric Acid, Serum 07/03/2017 7.1* 2.3 - 6.6 mg/dL Final   Performed at [facility], [address], [place], [place] [address]  . LDH 07/03/2017 111  98 - 192 U/L Final   Performed at [facility], [address], [place],  [place] [address]  . Sodium 07/03/2017 136  135 - 145 mmol/L Final  . Potassium 07/03/2017 3.1* 3.5 - 5.1 mmol/L Final  . Chloride 07/03/2017 97* 101 - 111 mmol/L Final  . CO2 07/03/2017 28  22 - 32 mmol/L Final  . Glucose, Bld 07/03/2017 88  65 - 99 mg/dL Final  . BUN 07/03/2017 17  6 - 20 mg/dL Final  . Creatinine, Ser 07/03/2017 0.85  0.44 - 1.00 mg/dL Final  . Calcium 07/03/2017 9.1  8.9 - 10.3 mg/dL Final  . Total Protein 07/03/2017 7.9  6.5 - 8.1 g/dL Final  . Albumin 07/03/2017 4.5  3.5 - 5.0 g/dL Final  . AST 07/03/2017 36  15 - 41 U/L Final  . ALT 07/03/2017 32  14 - 54 U/L Final  . Alkaline Phosphatase 07/03/2017 87  38 - 126 U/L Final  . Total Bilirubin 07/03/2017 0.7  0.3 - 1.2 mg/dL Final  .  GFR calc non Af Amer 07/03/2017 >60  >60 mL/min Final  . GFR calc Af Amer 07/03/2017 >60  >60 mL/min Final   Comment: (NOTE) The eGFR has been calculated using the CKD EPI equation. This calculation has not been validated in all clinical situations. eGFR's persistently <60 mL/min signify possible Chronic Kidney Disease.   Georgiann Hahn gap 07/03/2017 11  5 - 15 Final   Performed at [facility], [address] [place], [place] [address]  . WBC 07/03/2017 4.8  3.6 - 11.0 K/uL Final  . RBC 07/03/2017 4.88  3.80 - 5.20 MIL/uL Final  . Hemoglobin 07/03/2017 14.3  12.0 - 16.0 g/dL Final  . HCT 07/03/2017 42.5  35.0 - 47.0 % Final  . MCV 07/03/2017 87.1  80.0 - 100.0 fL Final  . MCH 07/03/2017 29.2  26.0 - 34.0 pg Final  . MCHC 07/03/2017 33.5  32.0 - 36.0 g/dL Final  . RDW 07/03/2017 14.7* 11.5 - 14.5 % Final  . Platelets 07/03/2017 236  150 - 440 K/uL Final  . Neutrophils Relative % 07/03/2017 60  % Final  . Neutro Abs 07/03/2017 2.9  1.4 - 6.5 K/uL Final  . Lymphocytes Relative 07/03/2017 29  % Final  . Lymphs Abs 07/03/2017 1.4  1.0 - 3.6 K/uL Final  . Monocytes Relative 07/03/2017 7  % Final  . Monocytes Absolute 07/03/2017 0.3  0.2 - 0.9 K/uL Final  . Eosinophils Relative  07/03/2017 3  % Final  . Eosinophils Absolute 07/03/2017 0.1  0 - 0.7 K/uL Final  . Basophils Relative 07/03/2017 1  % Final  . Basophils Absolute 07/03/2017 0.0  0 - 0.1 K/uL Final   Performed at [facility], [address], [place], [place] [address]    Assessment:  KIMERLY ROWAND is a 67 y.o. 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  She initially developed right axillary adenopathy in 10/2015.  She then developed waxing and waning left neck adenopathy.  Ultrasound guided biopsy of a left supraclavicular node on 07/02/2016 revealed grade I-II follicular lymphoma.  Chest CT on 06/18/2016 revealed left supraclavicular and bilateral axillary adenopathy.  The largest left axillary node measured 2 cm.  The largest right axillary node measured 3.5 x 2.2 cm.  There was no mediastinal or hilar adenopathy.  There was a calcified right hilar lymph nodes likely secondary to prior granulomatous disease. There was a 9 mm calcified granuloma in right lower lobe laterally.  PET scan on 07/24/2016 revealed hypermetabolic adenopathy in the neck, chest, abdomen, and pelvis, compatible with lymphoma.  Index nodes included a level IIa neck node of 1.8 cm (SUV 9.7), left axillary node 1.8 cm (SUV 6.9), AP window node 1.4 cm (SUV 6.9), 1.4 cm left periaortic lymph node (SUV 7.9), 2.7 cm right external iliac node (SUV 9.2), and right inguinal node (SUV 8.9).  There was mild marrow heterogeneity of activity but without a well-defined region of osseous malignant involvement.   Bone marrow aspirate and biopsy on  07/31/2016 revealed a slightly hypercellular marrow for age with atypical lymphoid aggregates and trilineage hematopoiesis.  Flow cytometry revealed a minor B cell population with lambda light chain excess representing < 10% of the lymphocytes with expression of pan B-cell antigens including CD20.  These findings are worrisome but not definitive for minimal involvement by a B-cell lymphoproliferative  process.  Overall findings are consistent with involvment by non-Hodgkin's lymphoma.  Symptomatically, she denies any B symptoms.  She has palpable adenopathy in  the left neck and axillae (stable). She notes that she fatigues easily. Uric acid is 7.1 (high).  Potassium is 3.1 (low).  Plan: 1.  Labs today:  CBC with diff, CMP, uric acid, LDH. 2.  Discuss elevated uric acid.  Patient declines allopurinol. Patient states, "Dr. Mike Gip and I agreed on 7.2. If it is there next time I will start the medicine". Patient educated on low purine diet. Handouts provided. Of note, patient has been taking "black cherry" in attempts to lower her uric acid.  3.  Discuss potassium.  Patient resistant to taking potassium supplement. She states, "I will eat more bananas or something, but I don't want to take a pill". Educated on potassium rich food choices.  4.  Discuss continued observation.  She does not have any B symptoms. She has stage IVa disease. Discuss indications for systemic treatment including B symptoms, bulky or disfiguring adenopathy, rapidly progressive disease, threatened organ function, cytopenias, or  autoimmune disease.  Lymph nodes are stable.  Patient wishes close observation.   5.  Discuss mammogram. Patient is overdue. She notes that GYN orders mammogram, and that she is scheduled to see her soon. Declined offer to allow NP to order today.  6.  RTC in 4 months for MD assessment and labs (CBC with diff, CMP, uric acid, LDH).   Honor Loh, NP  07/03/17, 2:58 PM

## 2017-07-06 DIAGNOSIS — E876 Hypokalemia: Secondary | ICD-10-CM | POA: Insufficient documentation

## 2017-10-22 ENCOUNTER — Other Ambulatory Visit: Payer: Self-pay | Admitting: Obstetrics and Gynecology

## 2017-10-22 DIAGNOSIS — Z1231 Encounter for screening mammogram for malignant neoplasm of breast: Secondary | ICD-10-CM

## 2017-11-04 ENCOUNTER — Ambulatory Visit
Admission: RE | Admit: 2017-11-04 | Discharge: 2017-11-04 | Disposition: A | Discharge status: Home / Self Care | Payer: Medicare Other | Source: Ambulatory Visit | Attending: Obstetrics and Gynecology | Admitting: Obstetrics and Gynecology

## 2017-11-04 DIAGNOSIS — Z1231 Encounter for screening mammogram for malignant neoplasm of breast: Secondary | ICD-10-CM | POA: Diagnosis present

## 2017-11-04 HISTORY — DX: Malignant (primary) neoplasm, unspecified: C80.1

## 2017-11-05 NOTE — Progress Notes (Signed)
[facility] day: . 03/06/2017   Chief Complaint: Alexandra Moore is a 67 y.o. female with follicular lymphoma who is seen for 4 month assessment.  HPI:   The patient was last seen in the medical oncology clinic on 07/03/2017.   At that time, patient was doing well overall.  Continue to complain of arthritic pain in her knees.  Patient was trying to be more active and was seeing a chiropractor for acupuncture.  Patient noted that she had been more fatigued over the last year.  Stated "I can do it, but I decided to place myself".  Exam revealed palpable adenopathy in the left neck and axillae (stable). Uric acid 7.1 (high).  Potassium 3.1 (low).  Bone density done on 08/21/2017 that revealed a T-score of 0.2 in the LEFT femoral neck. Bone density was normal.   Patient was to have diagnostic mammogram due to a suspicious finding in her RIGHT breast, however notes indicate the patient did not want to pay for.  Additionally,  she had a CT scan on 06/18/2016 that "cleared" her right breast.  Patient aware that her risk of cancer detection is much less with a screening mammogram. She ultimately elected to have a screening study only. Routine mammogram done on 11/04/2017 that revealed an enlarged lymph node in the RIGHT axilla. There was no mammographic evidence of malignancy in either breast.   Patient has declined routine colonoscopy as offered by her primary care physician and GYN provider.  In the interim, patient has been doing well overall.  She notes that her feet have been swelling a little more as she has been more active.  Patient notes that she has been doing a lot of gardening this summer.  Patient has no acute  concerns today.  Patient denies that she has experienced any B symptoms. She denies any interval infections.  She has not appreciated any new areas of palpable adenopathy.  She denies bruising and bleeding. Patient advises that she maintains an  adequate appetite. She is eating well. Weight today is 207 lb 7.3 oz (94.1 kg), which compared to her last visit to the clinic, represents a 3 pound decrease.  Patient denies pain in the clinic today.  Patient plans on a family trip in September.  Past Medical History:  Diagnosis Date  . Cancer ([place])    non hodgkins lymphoma- ? in remission  . Hypertension     No past surgical history on file.  Family History  Problem Relation Age of Onset  . Breast cancer Sister 42    Social History:  reports that she has never smoked. She has never used smokeless tobacco. She reports that she drinks alcohol. She reports that she does not use drugs.  She drinks wine once a month.  She denies any exposure to radiation or toxins.  She retired in 10/2015.  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  She previously lived in [place].  She moved to [place] in 1993.  She lives in [place].  Her husband is receiving chemotherapy.  The patient is alone today.  Allergies:  Allergies  Allergen Reactions  . Sulfa Antibiotics Hives    Current Medications: Current Outpatient Medications  Medication Sig Dispense Refill  . amLODipine (NORVASC) 5 MG tablet Take 5 mg by mouth daily.    . Cholecalciferol (VITAMIN D3) 1000 units CAPS Take 1,000 Units by mouth daily.    Marland Kitchen  Flaxseed Oil OIL Use once daily.    Marland Kitchen GLUCOSAMINE SULFATE PO Take 1 tablet by mouth daily.    . IRON PO Take 25 mg daily by mouth.    . magnesium gluconate (MAGONATE) 500 MG tablet Take 500 mg by mouth 2 (two) times daily.    . Multiple Vitamin (MULTI-VITAMINS) TABS Take 1 tablet by mouth daily.    . Potassium 99 MG TABS Take 1 tablet by mouth 2 (two) times daily.    . Prenatal Vit-Fe Fumarate-FA (VOL-NATE) 28-1 MG TABS Take 1 tablet by mouth daily.    . Probiotic Product (PROBIOTIC-10) CAPS Take 70 mg by mouth daily.    . TURMERIC PO Take 400 mg by mouth daily.     No current facility-administered  medications for this visit.    Review of Systems  Constitutional: Positive for malaise/fatigue (fatigues easily) and weight loss (down 3 pounds). Negative for diaphoresis and fever.       "I am doing well".  More active  HENT: Negative.   Eyes: Negative.   Respiratory: Negative for cough, hemoptysis, sputum production and shortness of breath.   Cardiovascular: Positive for leg swelling (feet/ankles). Negative for chest pain, palpitations, orthopnea and PND.  Gastrointestinal: Negative for abdominal pain, blood in stool, constipation, diarrhea, melena, nausea and vomiting.       No prior colonoscopy - patient cites that it is "too invasive".  Genitourinary: Negative for dysuria, frequency, hematuria and urgency.  Musculoskeletal: Positive for joint pain (arthritic pain in feet/ankles). Negative for back pain, falls and myalgias.  Skin: Negative for itching and rash.  Neurological: Negative for dizziness, tremors, weakness and headaches.  Endo/Heme/Allergies: Does not bruise/bleed easily.  Psychiatric/Behavioral: Negative for depression, memory loss and suicidal ideas. The patient is not nervous/anxious and does not have insomnia.   All other systems reviewed and are negative.  Performance status (ECOG): 0 - Asymptomatic  Vital Signs BP (!) 147/92 (BP Location: Left Arm, Patient Position: Sitting)   Pulse 71   Temp (!) 97.5 F (36.4 C) (Tympanic)   Resp 18   Wt 207 lb 7.3 oz (94.1 kg)   BMI 37.94 kg/m   Physical Exam  Constitutional: She is oriented to person, place, and time and well-developed, well-nourished, and in no distress.  HENT:  Head: Normocephalic and atraumatic.  Curly dark hair  Eyes: Pupils are equal, round, and reactive to light. EOM are normal. No scleral icterus.  Glasses.  Blue eyes.  Neck: Normal range of motion. Neck supple. No tracheal deviation present. No thyromegaly present.  Cardiovascular: Normal rate, regular rhythm and normal heart sounds. Exam reveals  no gallop and no friction rub.  No murmur heard. Pulmonary/Chest: Effort normal and breath sounds normal. No respiratory distress. She has no wheezes. She has no rales.  Abdominal: Soft. Bowel sounds are normal. She exhibits no distension. There is no tenderness.  Musculoskeletal: Normal range of motion. She exhibits edema (1+ non-pitting extending up to ankles). She exhibits no tenderness.  Lymphadenopathy:    She has cervical adenopathy.       Left cervical: Superficial cervical (2 cm low LEFT anterior cervical node; 1 cm high LEFT posterior cervical node) adenopathy present.    She has axillary adenopathy.       Right axillary: Lateral ( 2-2.5 cm RIGHT axillary node) adenopathy present.       Right: Inguinal (Shotty RIGHT inguinal node) adenopathy present. No supraclavicular adenopathy present.       Left: No inguinal and no  supraclavicular adenopathy present.  Neurological: She is alert and oriented to person, place, and time.  Skin: Skin is warm and dry. No rash noted. No erythema.  Psychiatric: Mood, affect and judgment normal.  Nursing note and vitals reviewed.   Appointment on 11/06/2017  Component Date Value Ref Range Status  . Uric Acid, Serum 11/06/2017 6.4  2.5 - 7.1 mg/dL Final   Comment: Please note change in reference range. Performed at [facility], [address], [place], [place] [address]   . LDH 11/06/2017 81* 98 - 192 U/L Final   Performed at [facility], [address] [place], [place] [address]  . Creatinine, Ser 11/06/2017 0.77  0.44 - 1.00 mg/dL Final  . GFR calc non Af Amer 11/06/2017 >60  >60 mL/min Final  . GFR calc Af Amer 11/06/2017 >60  >60 mL/min Final   Comment: (NOTE) The eGFR has been calculated using the CKD EPI equation. This calculation has not been validated in all clinical situations. eGFR's persistently <60 mL/min signify possible Chronic Kidney Disease. Performed at [facility], [address] [place], [place] [address]   . WBC 11/06/2017 5.0  3.6 - 11.0 K/uL Final  . RBC 11/06/2017 4.91  3.80 - 5.20 MIL/uL Final  . Hemoglobin 11/06/2017 14.4  12.0 - 16.0 g/dL Final  . HCT 11/06/2017 43.4  35.0 - 47.0 % Final  . MCV 11/06/2017 88.5  80.0 - 100.0 fL Final  . MCH 11/06/2017 29.4  26.0 - 34.0 pg Final  . MCHC 11/06/2017 33.2  32.0 - 36.0 g/dL Final  . RDW 11/06/2017 14.2  11.5 - 14.5 % Final  . Platelets 11/06/2017 265  150 - 440 K/uL Final  . Neutrophils Relative % 11/06/2017 57  % Final  . Neutro Abs 11/06/2017 2.9  1.4 - 6.5 K/uL Final  . Lymphocytes Relative 11/06/2017 32  % Final  . Lymphs Abs 11/06/2017 1.6  1.0 - 3.6 K/uL Final  . Monocytes Relative 11/06/2017 7  % Final  . Monocytes Absolute 11/06/2017 0.3  0.2 - 0.9 K/uL Final  . Eosinophils Relative 11/06/2017 3  % Final  . Eosinophils Absolute 11/06/2017 0.2  0 - 0.7 K/uL Final  . Basophils Relative 11/06/2017 1  % Final  . Basophils Absolute 11/06/2017 0.0  0 - 0.1 K/uL Final   Performed at [facility], [address] [place], [place] [address]    Assessment:  Alexandra Moore is a 67 y.o. 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  She initially developed right axillary adenopathy in 10/2015.  She then developed waxing and waning left neck adenopathy.  Ultrasound guided biopsy of a left supraclavicular node on 07/02/2016 revealed grade I-II follicular lymphoma.  Chest CT on 06/18/2016 revealed left supraclavicular and bilateral axillary adenopathy.  The largest left axillary node measured 2 cm.  The largest right axillary node measured 3.5 x 2.2 cm.  There was no mediastinal or hilar adenopathy.  There was a calcified right hilar lymph nodes likely secondary to prior granulomatous disease. There was a 9 mm calcified granuloma in right lower lobe laterally.  PET scan on 07/24/2016 revealed hypermetabolic adenopathy in the neck, chest, abdomen, and pelvis, compatible with lymphoma.  Index nodes included a level  IIa neck node of 1.8 cm (SUV 9.7), left axillary node 1.8 cm (SUV 6.9), AP window node 1.4 cm (SUV 6.9), 1.4 cm left periaortic lymph node (SUV 7.9), 2.7 cm right external iliac node (SUV  9.2), and right inguinal node (SUV 8.9).  There was mild marrow heterogeneity of activity but without a well-defined region of osseous malignant involvement.   Bone marrow aspirate and biopsy on  07/31/2016 revealed a slightly hypercellular marrow for age with atypical lymphoid aggregates and trilineage hematopoiesis.  Flow cytometry revealed a minor B cell population with lambda light chain excess representing < 10% of the lymphocytes with expression of pan B-cell antigens including CD20.  These findings are worrisome but not definitive for minimal involvement by a B-cell lymphoproliferative process.  Overall findings are consistent with involvment by non-Hodgkin's lymphoma.  Bone density on 08/21/2017 revealed a T-score of 0.2 in the LEFT femoral neck. Bone density was normal. Bilateral screening mammogram on 11/04/2017 that revealed an enlarged lymph node in the RIGHT axilla. There was no mammographic evidence of malignancy in either breast.  Symptomatically, patient is doing well.  She is trying to be more active.  She notes that she has arthritic pain and swelling in her feet, which she attributes to her increase in activity.  Patient has stable palpable adenopathy in the left neck and axilla.  She denies B symptoms or acute infections.  Patient denies acute concerns today.  WBC 5000 with an ANC of 2900.  Hemoglobin 14.4.  Platelets 265,000.  Creatinine stable at 0.77.  LDH 81. Uric acid has decreased to 6.4 (previously 7.1).  Plan: 1. Labs today:  CBC with diff, CMP, uric acid, LDH. 2. Review mammogram - normal.  3. Review bone density. T-score 0.2. Ten-year fracture probability by FRAX of 0.2% or greater for hip fracture or 6.1% or greater for major osteoporotic fracture. 4. Discuss hyperuricemia.  Uric acid  today is down to 6.4 (previously 7.1).  Patient attributes decrease consumption of daily apple cider vinegar.   5. Discuss follicular lymphoma diagnosis and continued observation.  She does not have any B symptoms. She has stage IVa disease. Discuss indications for systemic treatment including B symptoms, bulky or disfiguring adenopathy, rapidly progressive disease, threatened organ function, cytopenias, or  autoimmune disease.  Lymph nodes are stable.  Patient wishes to continue with plan of care as it stands at this point.  6.  RTC in 4 months for MD assessment and labs (CBC with diff, CMP, uric acid, LDH).   Honor Loh, NP  11/06/17, 10:19 AM

## 2017-11-06 ENCOUNTER — Inpatient Hospital Stay: Payer: Medicare Other | Attending: Hematology and Oncology | Admitting: Urgent Care

## 2017-11-06 ENCOUNTER — Inpatient Hospital Stay: Payer: Medicare Other

## 2017-11-06 VITALS — BP 147/92 | HR 71 | Temp 97.5°F | Resp 18 | Wt 207.5 lb

## 2017-11-06 DIAGNOSIS — M25472 Effusion, left ankle: Secondary | ICD-10-CM

## 2017-11-06 DIAGNOSIS — E790 Hyperuricemia without signs of inflammatory arthritis and tophaceous disease: Secondary | ICD-10-CM | POA: Diagnosis not present

## 2017-11-06 DIAGNOSIS — I10 Essential (primary) hypertension: Secondary | ICD-10-CM

## 2017-11-06 DIAGNOSIS — Z79899 Other long term (current) drug therapy: Secondary | ICD-10-CM | POA: Diagnosis not present

## 2017-11-06 DIAGNOSIS — C8218 Follicular lymphoma grade II, lymph nodes of multiple sites: Secondary | ICD-10-CM | POA: Diagnosis present

## 2017-11-06 DIAGNOSIS — M25471 Effusion, right ankle: Secondary | ICD-10-CM

## 2017-11-06 LAB — CBC WITH DIFFERENTIAL/PLATELET
BASOS PCT: 1 %
Basophils Absolute: 0 10*3/uL (ref 0–0.1)
EOS ABS: 0.2 10*3/uL (ref 0–0.7)
Eosinophils Relative: 3 %
HCT: 43.4 % (ref 35.0–47.0)
HEMOGLOBIN: 14.4 g/dL (ref 12.0–16.0)
LYMPHS ABS: 1.6 10*3/uL (ref 1.0–3.6)
Lymphocytes Relative: 32 %
MCH: 29.4 pg (ref 26.0–34.0)
MCHC: 33.2 g/dL (ref 32.0–36.0)
MCV: 88.5 fL (ref 80.0–100.0)
MONO ABS: 0.3 10*3/uL (ref 0.2–0.9)
Monocytes Relative: 7 %
NEUTROS PCT: 57 %
Neutro Abs: 2.9 10*3/uL (ref 1.4–6.5)
Platelets: 265 10*3/uL (ref 150–440)
RBC: 4.91 MIL/uL (ref 3.80–5.20)
RDW: 14.2 % (ref 11.5–14.5)
WBC: 5 10*3/uL (ref 3.6–11.0)

## 2017-11-06 LAB — LACTATE DEHYDROGENASE: LDH: 81 U/L — AB (ref 98–192)

## 2017-11-06 LAB — CREATININE, SERUM
CREATININE: 0.77 mg/dL (ref 0.44–1.00)
GFR calc Af Amer: >60 mL/min (ref >60)

## 2017-11-06 LAB — URIC ACID: Uric Acid, Serum: 6.4 mg/dL (ref 2.5–7.1)

## 2017-11-06 NOTE — Progress Notes (Signed)
Patient states she is now taking Gaffer.  Otherwise no changes.  Offers no complaints today.

## 2018-03-05 ENCOUNTER — Inpatient Hospital Stay: Payer: Medicare Other

## 2018-03-05 ENCOUNTER — Inpatient Hospital Stay: Payer: Medicare Other | Attending: Hematology and Oncology | Admitting: Hematology and Oncology

## 2018-03-05 ENCOUNTER — Other Ambulatory Visit: Payer: Self-pay

## 2018-03-05 VITALS — BP 125/91 | HR 87 | Temp 96.8°F | Resp 18 | Wt 212.1 lb

## 2018-03-05 DIAGNOSIS — I10 Essential (primary) hypertension: Secondary | ICD-10-CM | POA: Insufficient documentation

## 2018-03-05 DIAGNOSIS — E790 Hyperuricemia without signs of inflammatory arthritis and tophaceous disease: Secondary | ICD-10-CM | POA: Diagnosis not present

## 2018-03-05 DIAGNOSIS — C8218 Follicular lymphoma grade II, lymph nodes of multiple sites: Secondary | ICD-10-CM | POA: Diagnosis present

## 2018-03-05 DIAGNOSIS — Z79899 Other long term (current) drug therapy: Secondary | ICD-10-CM | POA: Diagnosis not present

## 2018-03-05 LAB — LACTATE DEHYDROGENASE: LDH: 84 U/L — AB (ref 98–192)

## 2018-03-05 LAB — CBC WITH DIFFERENTIAL/PLATELET
Abs Immature Granulocytes: 0.01 10*3/uL (ref 0.00–0.07)
BASOS ABS: 0 10*3/uL (ref 0.0–0.1)
Basophils Relative: 0 %
EOS ABS: 0.2 10*3/uL (ref 0.0–0.5)
EOS PCT: 2 %
HCT: 41 % (ref 36.0–46.0)
Hemoglobin: 13.6 g/dL (ref 12.0–15.0)
Immature Granulocytes: 0 %
LYMPHS ABS: 1.7 10*3/uL (ref 0.7–4.0)
Lymphocytes Relative: 24 %
MCH: 29.4 pg (ref 26.0–34.0)
MCHC: 33.2 g/dL (ref 30.0–36.0)
MCV: 88.7 fL (ref 80.0–100.0)
MONO ABS: 0.4 10*3/uL (ref 0.1–1.0)
MONOS PCT: 6 %
Neutro Abs: 4.7 10*3/uL (ref 1.7–7.7)
Neutrophils Relative %: 68 %
Platelets: 271 10*3/uL (ref 150–400)
RBC: 4.62 MIL/uL (ref 3.87–5.11)
RDW: 14.6 % (ref 11.5–15.5)
WBC: 7 10*3/uL (ref 4.0–10.5)
nRBC: 0 % (ref 0.0–0.2)

## 2018-03-05 LAB — COMPREHENSIVE METABOLIC PANEL
ALK PHOS: 102 U/L (ref 38–126)
ALT: 29 U/L (ref 0–44)
AST: 27 U/L (ref 15–41)
Albumin: 4.6 g/dL (ref 3.5–5.0)
Anion gap: 10 (ref 5–15)
BUN: 17 mg/dL (ref 8–23)
CALCIUM: 9.1 mg/dL (ref 8.9–10.3)
CO2: 29 mmol/L (ref 22–32)
CREATININE: 0.84 mg/dL (ref 0.44–1.00)
Chloride: 102 mmol/L (ref 98–111)
GFR calc Af Amer: >60 mL/min (ref >60)
GFR calc non Af Amer: >60 mL/min (ref >60)
Glucose, Bld: 93 mg/dL (ref 70–99)
Potassium: 3.4 mmol/L — ABNORMAL LOW (ref 3.5–5.1)
Sodium: 141 mmol/L (ref 135–145)
Total Bilirubin: 0.6 mg/dL (ref 0.3–1.2)
Total Protein: 7.5 g/dL (ref 6.5–8.1)

## 2018-03-05 LAB — URIC ACID: URIC ACID, SERUM: 6.5 mg/dL (ref 2.5–7.1)

## 2018-03-05 NOTE — Progress Notes (Signed)
[facility] day:  03/05/2018   Chief Complaint: Alexandra Moore is a 67 y.o. female with follicular lymphoma who is seen for 4 month assessment.  HPI:   The patient was last seen in the medical oncology clinic on 11/06/2017.   At that time, she was doing well.  She was trying to be more active.  She noted arthritic pain and swelling in her feet, which she attributed to her increase in activity.  Patient had stable palpable adenopathy in the left neck and axilla.  She denied B symptoms or acute infections.  WBC was 5000 with an ANC of 2900.  Hemoglobin was 14.4.  Platelets were 265,000.  Creatinine was 0.77.  LDH was 81. Uric acid had decreased to 6.4 (previously 7.1)  Symptomatically, the patient is doing well overall.  She continues to complain of pain and swelling in her feet.  She is currently seeing a chiropractor due to a "muscular band under her feet". She notes that her symptoms have improved since being under chiropractic care. Patient continues to have pain in her LEFT knee that she attributes to her known bursitis.    Patient making efforts to remain active as possible.  She enjoys working in her garden.  Patient recently took a month long car trip, whereby she traveled in excess of 8000 miles around the [place] visiting sites, friends, and family.  Patient denies that she has experienced any B symptoms. She denies any interval infections.  Patient denies any new areas of palpable adenopathy.  Patient advises that she maintains an adequate appetite. She is eating well. Weight today is 212 lb 1.3 oz (96.2 kg), which compared to her last visit to the clinic, represents a 5 pound increase.  Patient denies pain in the clinic today.   Past Medical History:  Diagnosis Date  . Cancer ([place])    non hodgkins lymphoma- ? in remission  . Hypertension     No past surgical history on file.  Family History  Problem Relation Age of Onset  .  Breast cancer Sister 30    Social History:  reports that she has never smoked. She has never used smokeless tobacco. She reports that she drinks alcohol. She reports that she does not use drugs.  She drinks wine once a month.  She denies any exposure to radiation or toxins.  She retired in 10/2015.  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  She previously lived in [place].  She moved to [place] in 1993.  She lives in [place].  Her husband is receiving chemotherapy.  The patient is alone today.  Allergies:  Allergies  Allergen Reactions  . Sulfa Antibiotics Hives    Current Medications: Current Outpatient Medications  Medication Sig Dispense Refill  . amLODipine (NORVASC) 5 MG tablet Take 5 mg by mouth daily.    . Cholecalciferol (VITAMIN D3) 1000 units CAPS Take 1,000 Units by mouth daily.    . Flaxseed Oil OIL Use once daily.    . Garlic Oil 2 MG CAPS Take by mouth.    Marland Kitchen GLUCOSAMINE SULFATE PO Take 1 tablet by mouth daily.    . IRON PO Take 25 mg daily by mouth.    . magnesium gluconate (MAGONATE) 500 MG tablet Take 500 mg by mouth 2 (two) times daily.    . Multiple Vitamin (MULTI-VITAMINS) TABS Take 1 tablet by mouth daily.    Marland Kitchen  Potassium 99 MG TABS Take 1 tablet by mouth 2 (two) times daily.    . Probiotic Product (PROBIOTIC-10) CAPS Take 70 mg by mouth daily.    . TURMERIC PO Take 400 mg by mouth daily.    . Oil of Oregano 1500 MG CAPS Take by mouth.     No current facility-administered medications for this visit.     Review of Systems  Constitutional: Negative for diaphoresis, fever, malaise/fatigue and weight loss (up 5 pounds).       "I am doing well".  Remains active.  HENT: Negative.   Eyes: Negative.   Respiratory: Negative for cough, hemoptysis, sputum production and shortness of breath.   Cardiovascular: Positive for leg swelling (pain and swelling in feet). Negative for chest pain, palpitations, orthopnea and PND.   Gastrointestinal: Negative for abdominal pain, blood in stool, constipation, diarrhea, melena, nausea and vomiting.       No prior colonoscopy - patient cites that it is "too invasive".  Genitourinary: Negative for dysuria, frequency, hematuria and urgency.  Musculoskeletal: Positive for joint pain (LEFT knee secondary to bursitis). Negative for back pain, falls and myalgias.  Skin: Negative for itching and rash.  Neurological: Negative for dizziness, tremors, weakness and headaches.  Endo/Heme/Allergies: Does not bruise/bleed easily.  Psychiatric/Behavioral: Negative for depression, memory loss and suicidal ideas. The patient is not nervous/anxious and does not have insomnia.   All other systems reviewed and are negative.  Performance status (ECOG): 0 - Asymptomatic  Vital Signs BP (!) 125/91 (BP Location: Left Arm, Patient Position: Sitting)   Pulse 87   Temp (!) 96.8 F (36 C) (Tympanic)   Resp 18   Wt 212 lb 1.3 oz (96.2 kg)   BMI 38.79 kg/m   Physical Exam  Constitutional: She is oriented to person, place, and time and well-developed, well-nourished, and in no distress. No distress.  HENT:  Head: Normocephalic and atraumatic.  Mouth/Throat: Oropharynx is clear and moist and mucous membranes are normal. No oropharyngeal exudate.  Curly dark hair.  Eyes: Pupils are equal, round, and reactive to light. Conjunctivae and EOM are normal. No scleral icterus.  Glasses.  Brown eyes.  Neck: Normal range of motion. Neck supple. No JVD present.  Cardiovascular: Normal rate, regular rhythm, normal heart sounds and intact distal pulses. Exam reveals no gallop and no friction rub.  No murmur heard. Pulmonary/Chest: Effort normal and breath sounds normal. No respiratory distress. She has no wheezes. She has no rales.  Abdominal: Soft. Bowel sounds are normal. She exhibits no distension. There is no tenderness.  Musculoskeletal: Normal range of motion.        General: Edema (1+ non-pitting  ankle edema) present. No tenderness.  Lymphadenopathy:    She has cervical adenopathy (small).    She has axillary adenopathy (stable).       Right axillary: Lateral adenopathy present.       Right: Inguinal (shotty) adenopathy present. No supraclavicular adenopathy present.       Left: No inguinal and no supraclavicular adenopathy present.  Neurological: She is alert and oriented to person, place, and time.  Skin: Skin is warm and dry. No rash noted. She is not diaphoretic. No erythema.  Psychiatric: Mood, affect and judgment normal.  Nursing note and vitals reviewed.   Appointment on 03/05/2018  Component Date Value Ref Range Status  . Uric Acid, Serum 03/05/2018 6.5  2.5 - 7.1 mg/dL Final   Performed at [facility], [address] [place], [place]  [address]  . LDH 03/05/2018 84* 98 - 192 U/L Final   Performed at [facility], [address] [place], [place] [address]  . Sodium 03/05/2018 141  135 - 145 mmol/L Final  . Potassium 03/05/2018 3.4* 3.5 - 5.1 mmol/L Final  . Chloride 03/05/2018 102  98 - 111 mmol/L Final  . CO2 03/05/2018 29  22 - 32 mmol/L Final  . Glucose, Bld 03/05/2018 93  70 - 99 mg/dL Final  . BUN 03/05/2018 17  8 - 23 mg/dL Final  . Creatinine, Ser 03/05/2018 0.84  0.44 - 1.00 mg/dL Final  . Calcium 03/05/2018 9.1  8.9 - 10.3 mg/dL Final  . Total Protein 03/05/2018 7.5  6.5 - 8.1 g/dL Final  . Albumin 03/05/2018 4.6  3.5 - 5.0 g/dL Final  . AST 03/05/2018 27  15 - 41 U/L Final  . ALT 03/05/2018 29  0 - 44 U/L Final  . Alkaline Phosphatase 03/05/2018 102  38 - 126 U/L Final  . Total Bilirubin 03/05/2018 0.6  0.3 - 1.2 mg/dL Final  . GFR calc non Af Amer 03/05/2018 >60  >60 mL/min Final  . GFR calc Af Amer 03/05/2018 >60  >60 mL/min Final   Comment: (NOTE) The eGFR has been calculated using the CKD EPI equation. This calculation has not been validated in all clinical situations. eGFR's persistently <60 mL/min signify possible  Chronic Kidney Disease.   Georgiann Hahn gap 03/05/2018 10  5 - 15 Final   Performed at [facility], [address] [place], [place] [address]  . WBC 03/05/2018 7.0  4.0 - 10.5 K/uL Final  . RBC 03/05/2018 4.62  3.87 - 5.11 MIL/uL Final  . Hemoglobin 03/05/2018 13.6  12.0 - 15.0 g/dL Final  . HCT 03/05/2018 41.0  36.0 - 46.0 % Final  . MCV 03/05/2018 88.7  80.0 - 100.0 fL Final  . MCH 03/05/2018 29.4  26.0 - 34.0 pg Final  . MCHC 03/05/2018 33.2  30.0 - 36.0 g/dL Final  . RDW 03/05/2018 14.6  11.5 - 15.5 % Final  . Platelets 03/05/2018 271  150 - 400 K/uL Final  . nRBC 03/05/2018 0.0  0.0 - 0.2 % Final  . Neutrophils Relative % 03/05/2018 68  % Final  . Neutro Abs 03/05/2018 4.7  1.7 - 7.7 K/uL Final  . Lymphocytes Relative 03/05/2018 24  % Final  . Lymphs Abs 03/05/2018 1.7  0.7 - 4.0 K/uL Final  . Monocytes Relative 03/05/2018 6  % Final  . Monocytes Absolute 03/05/2018 0.4  0.1 - 1.0 K/uL Final  . Eosinophils Relative 03/05/2018 2  % Final  . Eosinophils Absolute 03/05/2018 0.2  0.0 - 0.5 K/uL Final  . Basophils Relative 03/05/2018 0  % Final  . Basophils Absolute 03/05/2018 0.0  0.0 - 0.1 K/uL Final  . Immature Granulocytes 03/05/2018 0  % Final  . Abs Immature Granulocytes 03/05/2018 0.01  0.00 - 0.07 K/uL Final   Performed at [facility], [address], [place], [place] [address]    Assessment:  [place]andra [place] is a 67 y.o. 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  She initially developed right axillary adenopathy in 10/2015.  She then developed waxing and waning left neck adenopathy.  Ultrasound guided biopsy of a left supraclavicular node on 07/02/2016 revealed grade I-II follicular lymphoma.  Chest CT on 06/18/2016 revealed left supraclavicular and bilateral axillary adenopathy.  The largest left axillary node measured 2 cm.  The largest  right axillary node measured 3.5 x 2.2 cm.  There was no mediastinal or hilar adenopathy.  There was a calcified right  hilar lymph nodes likely secondary to prior granulomatous disease. There was a 9 mm calcified granuloma in right lower lobe laterally.  PET scan on 07/24/2016 revealed hypermetabolic adenopathy in the neck, chest, abdomen, and pelvis, compatible with lymphoma.  Index nodes included a level IIa neck node of 1.8 cm (SUV 9.7), left axillary node 1.8 cm (SUV 6.9), AP window node 1.4 cm (SUV 6.9), 1.4 cm left periaortic lymph node (SUV 7.9), 2.7 cm right external iliac node (SUV 9.2), and right inguinal node (SUV 8.9).  There was mild marrow heterogeneity of activity but without a well-defined region of osseous malignant involvement.   Bone marrow aspirate and biopsy on  07/31/2016 revealed a slightly hypercellular marrow for age with atypical lymphoid aggregates and trilineage hematopoiesis.  Flow cytometry revealed a minor B cell population with lambda light chain excess representing < 10% of the lymphocytes with expression of pan B-cell antigens including CD20.  These findings are worrisome but not definitive for minimal involvement by a B-cell lymphoproliferative process.  Overall findings are consistent with involvment by non-Hodgkin's lymphoma.  Bone density on 08/21/2017 revealed a T-score of 0.2 in the LEFT femoral neck. Bone density was normal. Bilateral screening mammogram on 11/04/2017 that revealed an enlarged lymph node in the RIGHT axilla. There was no mammographic evidence of malignancy in either breast.  Symptomatically, she is doing well overall.  She denies any acute concerns.  Patient is working with a Restaurant manager, fast food to treat a "muscular band under her feet".  She has moderate swelling in her feet.  Patient complains of LEFT knee pain secondary to bursitis.  She remains active.  Exam reveals small palpable cervical, axillary, and inguinal adenopathy.  WBC 7000 ([place] 4700).  Potassium 3.4.  BUN 17 and creatinine 0.4.   Plan: 1. Labs today:  CBC with diff, CMP, LDH, uric acid. 2. Stage IVA  follicular lymphoma  Patient doing well overall.  She denies any symptoms.  No new areas of palpable adenopathy.  Previous areas of adenopathy stable.  Labs reviewed and stable.  Discuss continued surveillance and indications for systemic treatment including the symptoms, bulky or disfiguring adenopathy, rapidly progressive disease, threatened organ function, cytopenias, or autoimmune disease. 3. Hyperuricemia  Uric acid stable at 6.5 mg/dL (previously 6.4 mg/dL).  Patient has not required treatment.  Managing uric acid level with daily consumption of apple cider vinegar.  Continue routine lab monitoring. 4. RTC in 6 months for MD assessment and labs (CBC with diff, CMP, uric acid, LDH).   Honor Loh, NP  03/05/18, 1:18 PM   I saw and evaluated the patient, participating in the key portions of the service and reviewing pertinent diagnostic studies and records.  I reviewed the nurse practitioner's note and agree with the findings and the plan.  The assessment and plan were discussed with the patient.  A few questions were asked by the patient and answered.   Nolon Stalls, MD 03/05/2018,1:18 PM

## 2018-03-05 NOTE — Progress Notes (Signed)
Here for follow up. Feeling great per pt. Taking elderberry 450 mg qd pt stated in addition to all other " winter time " vitamins

## 2018-04-21 ENCOUNTER — Encounter: Payer: Self-pay | Admitting: Hematology and Oncology

## 2018-08-22 ENCOUNTER — Encounter: Payer: Self-pay | Admitting: Hematology and Oncology

## 2018-09-03 ENCOUNTER — Ambulatory Visit: Payer: Medicare Other | Admitting: Hematology and Oncology

## 2018-09-03 ENCOUNTER — Other Ambulatory Visit: Payer: Medicare Other

## 2018-09-30 NOTE — Progress Notes (Addendum)
[facility]  [address], [address] [place], [place] [address] Phone: [phone]  Fax: [phone]   Clinic Day:  10/01/2018  Referring physician: Rusty Aus, MD  Chief Complaint: Alexandra Moore is a 68 y.o. female with follicular lymphoma who is seen for 7 month assessment.  HPI: The patient was last seen in the medical oncology clinic on 03/05/2018. At that time, she was doing well overall. Patient was working with a Restaurant manager, fast food to treat a "muscular band under her feet".  She had moderate swelling in her feet.  Patient complained of left knee pain secondary to bursitis.  She remained active.  Exam revealed small palpable cervical, axillary, and inguinal adenopathy.  WBC 7000 ([place] 4700).  Potassium 3.4.  BUN 17 and creatinine 0.4.   During the interim, she is doing well. She denies any fevers, sweats, or weight loss. She denies any lumps or bumps. She denies any bruising, bleeding, or infections. She goes to bed at about 7pm and wakes up between 3-4am most nights, but notes this is normal for her.   Her foot issues are being managed well with her chiropractor. Her left knee pain has resolved. She has bursitis in her right arm from pulling weeds.   She has returned to exercising at the gym. She is currently on a 99mg  potassium supplement BID, and has not been able to eat her daily banana the past 4 days.    Past Medical History:  Diagnosis Date  . Cancer ([place])    non hodgkins lymphoma- ? in remission  . Hypertension     History reviewed. No pertinent surgical history.  Family History  Problem Relation Age of Onset  . Breast cancer Sister 56    Social History:  reports that she has never smoked. She has never used smokeless tobacco. She reports current alcohol use. She reports that she does not use drugs. She drinks wine once a month.  She denies any exposure to radiation or toxins.  She retired in 10/2015.  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  She is temporarily out of retirement.  She previously lived in [place].  She moved to [place] in 1993.  She lives in [place]. Her husband is receiving chemotherapy. She has an extensive vegetable garden in her yard. The patient is alone today.  Allergies:  Allergies  Allergen Reactions  . Diphenhydramine Hcl Other (See Comments)    Chills  . Sulfa Antibiotics Hives    Current Medications: Current Outpatient Medications  Medication Sig Dispense Refill  . amLODipine (NORVASC) 5 MG tablet Take 5 mg by mouth daily.    . Cholecalciferol (VITAMIN D3) 1000 units CAPS Take 1,000 Units by mouth daily.    . Flaxseed Oil OIL Use once daily.    Marland Kitchen GLUCOSAMINE SULFATE PO Take 1 tablet by mouth daily.    . IRON PO Take 25 mg daily by mouth.    . magnesium gluconate (MAGONATE) 500 MG tablet Take 500 mg by mouth 2 (two) times daily.    . Multiple Vitamin (MULTI-VITAMINS) TABS Take 1 tablet by mouth daily.    . Potassium 99 MG TABS Take 1 tablet by mouth 2 (two) times daily.    . Probiotic Product (PROBIOTIC-10) CAPS Take 70 mg by mouth daily.    . TURMERIC PO Take 400 mg by mouth daily.     No current facility-administered medications for this visit.     Review of Systems  Constitutional: Negative  for diaphoresis, fever, malaise/fatigue and weight loss (up 2 pounds).       "I am doing well".  Remains active.  HENT: Negative.  Negative for congestion, hearing loss, sinus pain and sore throat.   Eyes: Negative.  Negative for blurred vision.  Respiratory: Negative for cough, hemoptysis, sputum production and shortness of breath.   Cardiovascular: Positive for leg swelling (pain and swelling in feet). Negative for chest pain, palpitations, orthopnea and PND.  Gastrointestinal: Negative for abdominal pain, blood in stool, constipation, diarrhea, melena, nausea and vomiting.       No prior colonoscopy - patient cites that it is "too invasive".  Genitourinary: Negative for dysuria,  frequency, hematuria and urgency.  Musculoskeletal: Positive for joint pain (right shoulder secondary to bursitis). Negative for back pain, falls and myalgias.  Skin: Negative for itching and rash.  Neurological: Negative for dizziness, tremors, weakness and headaches.  Endo/Heme/Allergies: Does not bruise/bleed easily.  Psychiatric/Behavioral: Negative for depression, memory loss and suicidal ideas. The patient has insomnia (wakes up very early in the morning). The patient is not nervous/anxious.   All other systems reviewed and are negative.  Performance status (ECOG): 0  Physical Exam  Constitutional: She is oriented to person, place, and time. She appears well-developed and well-nourished. No distress.  HENT:  Head: Normocephalic and atraumatic.  Mouth/Throat: Oropharynx is clear and moist. No oropharyngeal exudate.  Short curly brown hair. Mask.  Eyes: Pupils are equal, round, and reactive to light. Conjunctivae and EOM are normal. No scleral icterus.  Glasses.  Brown eyes.   Neck: Normal range of motion. Neck supple.  Cardiovascular: Normal rate, regular rhythm and normal heart sounds.  No murmur heard. Pulmonary/Chest: Effort normal and breath sounds normal. No respiratory distress. She has no wheezes.  Abdominal: Soft. Bowel sounds are normal. She exhibits no distension. There is no hepatosplenomegaly. There is no abdominal tenderness.  Musculoskeletal: Normal range of motion.        General: No edema.  Lymphadenopathy:       Head (right side): No tonsillar adenopathy present.       Head (left side): No tonsillar adenopathy present.    She has cervical adenopathy (small).    She has no axillary adenopathy.       Right: No inguinal and no supraclavicular adenopathy present.       Left: No inguinal and no supraclavicular adenopathy present.  Neurological: She is alert and oriented to person, place, and time.  Skin: Skin is warm and dry. She is not diaphoretic. No erythema.   Psychiatric: She has a normal mood and affect. Her behavior is normal. Judgment and thought content normal.  Nursing note and vitals reviewed.   Appointment on 10/01/2018  Component Date Value Ref Range Status  . Uric Acid, Serum 10/01/2018 6.6  2.5 - 7.1 mg/dL Final   Performed at [facility], [address], [place], [place] [address]  . LDH 10/01/2018 79* 98 - 192 U/L Final   Performed at [facility], [address] [place] [address], [place], [place] [address]  . Sodium 10/01/2018 139  135 - 145 mmol/L Final  . Potassium 10/01/2018 3.0* 3.5 - 5.1 mmol/L Final  . Chloride 10/01/2018 99  98 - 111 mmol/L Final  . CO2 10/01/2018 29  22 - 32 mmol/L Final  . Glucose, Bld 10/01/2018 89  70 - 99 mg/dL Final  . BUN 10/01/2018 23  8 - 23 mg/dL Final  . Creatinine, Ser 10/01/2018 0.97  0.44 -  1.00 mg/dL Final  . Calcium 10/01/2018 9.5  8.9 - 10.3 mg/dL Final  . Total Protein 10/01/2018 7.3  6.5 - 8.1 g/dL Final  . Albumin 10/01/2018 4.5  3.5 - 5.0 g/dL Final  . AST 10/01/2018 26  15 - 41 U/L Final  . ALT 10/01/2018 28  0 - 44 U/L Final  . Alkaline Phosphatase 10/01/2018 98  38 - 126 U/L Final  . Total Bilirubin 10/01/2018 0.7  0.3 - 1.2 mg/dL Final  . GFR calc non Af Amer 10/01/2018 60* >60 mL/min Final  . GFR calc Af Amer 10/01/2018 >60  >60 mL/min Final  . Anion gap 10/01/2018 11  5 - 15 Final   Performed at [facility], [address], [place], [place] [address]  . WBC 10/01/2018 7.6  4.0 - 10.5 K/uL Final  . RBC 10/01/2018 4.56  3.87 - 5.11 MIL/uL Final  . Hemoglobin 10/01/2018 13.5  12.0 - 15.0 g/dL Final  . HCT 10/01/2018 39.5  36.0 - 46.0 % Final  . MCV 10/01/2018 86.6  80.0 - 100.0 fL Final  . MCH 10/01/2018 29.6  26.0 - 34.0 pg Final  . MCHC 10/01/2018 34.2  30.0 - 36.0 g/dL Final  . RDW 10/01/2018 14.6  11.5 - 15.5 % Final  . Platelets 10/01/2018 242  150 - 400 K/uL Final  . nRBC 10/01/2018 0.0  0.0 - 0.2 % Final  . Neutrophils Relative % 10/01/2018  65  % Final  . Neutro Abs 10/01/2018 4.9  1.7 - 7.7 K/uL Final  . Lymphocytes Relative 10/01/2018 25  % Final  . Lymphs Abs 10/01/2018 1.9  0.7 - 4.0 K/uL Final  . Monocytes Relative 10/01/2018 7  % Final  . Monocytes Absolute 10/01/2018 0.5  0.1 - 1.0 K/uL Final  . Eosinophils Relative 10/01/2018 2  % Final  . Eosinophils Absolute 10/01/2018 0.2  0.0 - 0.5 K/uL Final  . Basophils Relative 10/01/2018 1  % Final  . Basophils Absolute 10/01/2018 0.1  0.0 - 0.1 K/uL Final  . Immature Granulocytes 10/01/2018 0  % Final  . Abs Immature Granulocytes 10/01/2018 0.02  0.00 - 0.07 K/uL Final   Performed at [facility], [address], [place], [place] [address]    Assessment:  Alexandra Moore is a 68 y.o. 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  She initially developed right axillary adenopathy in 10/2015.  She then developed waxing and waning left neck adenopathy.  Ultrasound guided biopsy of a left supraclavicular node on 07/02/2016 revealed grade I-II follicular lymphoma.  Chest CT on 06/18/2016 revealed left supraclavicular and bilateral axillary adenopathy.  The largest left axillary node measured 2 cm.  The largest right axillary node measured 3.5 x 2.2 cm.  There was no mediastinal or hilar adenopathy.  There was a calcified right hilar lymph nodes likely secondary to prior granulomatous disease. There was a 9 mm calcified granuloma in right lower lobe laterally.  PET scan on 07/24/2016 revealed hypermetabolic adenopathy in the neck, chest, abdomen, and pelvis, compatible with lymphoma.  Index nodes included a level IIa neck node of 1.8 cm (SUV 9.7), left axillary node 1.8 cm (SUV 6.9), AP window node 1.4 cm (SUV 6.9), 1.4 cm left periaortic lymph node (SUV 7.9), 2.7 cm right external iliac node (SUV 9.2), and right inguinal node (SUV 8.9).  There was mild marrow heterogeneity of activity but without a well-defined region of osseous malignant involvement.   Bone marrow aspirate  and biopsy  on  07/31/2016 revealed a slightly hypercellular marrow for age with atypical lymphoid aggregates and trilineage hematopoiesis.  Flow cytometry revealed a minor B cell population with lambda light chain excess representing < 10% of the lymphocytes with expression of pan B-cell antigens including CD20.  These findings are worrisome but not definitive for minimal involvement by a B-cell lymphoproliferative process.  Overall findings are consistent with involvment by non-Hodgkin's lymphoma.  Bone density on 08/21/2017 was normal with a T-score of 0.2 in the LEFT femoral neck.  Bilateral screening mammogram on 11/04/2017 that revealed an enlarged lymph node in the RIGHT axilla. There was no mammographic evidence of malignancy in either breast.  Symptomatically, she is doing well.  She denies any B symptoms.  Exam is unremarkable.  Hemoglobin is 13.5, platelets 242,000, and WBC 7600 ([place] 4900).  Potassium is 3.0.  Plan: 1. Labs today:  CBC with diff, CMP, LDH, uric acid.  2. Stage IVA follicular lymphoma Clinically, she is doing well. She denies any B symptoms. Counts are normal. Continue surveillance. 3. Hyperuricemia Uric acid 6.6 (normal). Continue to monitor. 4.   Hypokalemia  Potassium is 3.0.  Patient on OTC potassium supplement and K rich foods.  Follow-up with Dr Sabra Heck. 5.   RTC in 3 months for MD assessment labs (CBC with diff, CMP, uric acid).  I discussed the assessment and treatment plan with the patient.  The patient was provided an opportunity to ask questions and all were answered.  The patient agreed with the plan and demonstrated an understanding of the instructions.  The patient was advised to call back if the symptoms worsen or if the condition fails to improve as anticipated.  I provided 15 minutes of face-to-face time during this this encounter and > 50% was spent counseling as documented under my assessment and plan.    Lequita Asal, MD, PhD     10/01/2018, 12:03 PM  I, Molly Dorshimer, am acting as Education administrator for [employer]. Mike Gip, MD, PhD.  I, Melissa C. Mike Gip, MD, have reviewed the above documentation for accuracy and completeness, and I agree with the above.

## 2018-10-01 ENCOUNTER — Inpatient Hospital Stay (HOSPITAL_BASED_OUTPATIENT_CLINIC_OR_DEPARTMENT_OTHER): Payer: Medicare Other | Admitting: Hematology and Oncology

## 2018-10-01 ENCOUNTER — Encounter: Payer: Self-pay | Admitting: Hematology and Oncology

## 2018-10-01 ENCOUNTER — Inpatient Hospital Stay: Payer: Medicare Other | Attending: Hematology and Oncology

## 2018-10-01 ENCOUNTER — Other Ambulatory Visit: Payer: Self-pay

## 2018-10-01 VITALS — BP 149/92 | HR 92 | Temp 97.9°F | Resp 18 | Wt 214.4 lb

## 2018-10-01 DIAGNOSIS — R590 Localized enlarged lymph nodes: Secondary | ICD-10-CM | POA: Insufficient documentation

## 2018-10-01 DIAGNOSIS — Z803 Family history of malignant neoplasm of breast: Secondary | ICD-10-CM

## 2018-10-01 DIAGNOSIS — E876 Hypokalemia: Secondary | ICD-10-CM

## 2018-10-01 DIAGNOSIS — C8218 Follicular lymphoma grade II, lymph nodes of multiple sites: Secondary | ICD-10-CM

## 2018-10-01 DIAGNOSIS — E790 Hyperuricemia without signs of inflammatory arthritis and tophaceous disease: Secondary | ICD-10-CM | POA: Diagnosis not present

## 2018-10-01 DIAGNOSIS — Z8572 Personal history of non-Hodgkin lymphomas: Secondary | ICD-10-CM | POA: Diagnosis not present

## 2018-10-01 DIAGNOSIS — Z882 Allergy status to sulfonamides status: Secondary | ICD-10-CM | POA: Insufficient documentation

## 2018-10-01 DIAGNOSIS — M7989 Other specified soft tissue disorders: Secondary | ICD-10-CM | POA: Insufficient documentation

## 2018-10-01 DIAGNOSIS — Z79899 Other long term (current) drug therapy: Secondary | ICD-10-CM | POA: Insufficient documentation

## 2018-10-01 LAB — URIC ACID: Uric Acid, Serum: 6.6 mg/dL (ref 2.5–7.1)

## 2018-10-01 LAB — COMPREHENSIVE METABOLIC PANEL
ALT: 28 U/L (ref 0–44)
AST: 26 U/L (ref 15–41)
Albumin: 4.5 g/dL (ref 3.5–5.0)
Alkaline Phosphatase: 98 U/L (ref 38–126)
Anion gap: 11 (ref 5–15)
BUN: 23 mg/dL (ref 8–23)
CO2: 29 mmol/L (ref 22–32)
Calcium: 9.5 mg/dL (ref 8.9–10.3)
Chloride: 99 mmol/L (ref 98–111)
Creatinine, Ser: 0.97 mg/dL (ref 0.44–1.00)
GFR calc Af Amer: >60 mL/min (ref >60)
GFR calc non Af Amer: 60 mL/min — ABNORMAL LOW (ref >60)
Glucose, Bld: 89 mg/dL (ref 70–99)
Potassium: 3 mmol/L — ABNORMAL LOW (ref 3.5–5.1)
Sodium: 139 mmol/L (ref 135–145)
Total Bilirubin: 0.7 mg/dL (ref 0.3–1.2)
Total Protein: 7.3 g/dL (ref 6.5–8.1)

## 2018-10-01 LAB — CBC WITH DIFFERENTIAL/PLATELET
Abs Immature Granulocytes: 0.02 10*3/uL (ref 0.00–0.07)
Basophils Absolute: 0.1 10*3/uL (ref 0.0–0.1)
Basophils Relative: 1 %
Eosinophils Absolute: 0.2 10*3/uL (ref 0.0–0.5)
Eosinophils Relative: 2 %
HCT: 39.5 % (ref 36.0–46.0)
Hemoglobin: 13.5 g/dL (ref 12.0–15.0)
Immature Granulocytes: 0 %
Lymphocytes Relative: 25 %
Lymphs Abs: 1.9 10*3/uL (ref 0.7–4.0)
MCH: 29.6 pg (ref 26.0–34.0)
MCHC: 34.2 g/dL (ref 30.0–36.0)
MCV: 86.6 fL (ref 80.0–100.0)
Monocytes Absolute: 0.5 10*3/uL (ref 0.1–1.0)
Monocytes Relative: 7 %
Neutro Abs: 4.9 10*3/uL (ref 1.7–7.7)
Neutrophils Relative %: 65 %
Platelets: 242 10*3/uL (ref 150–400)
RBC: 4.56 MIL/uL (ref 3.87–5.11)
RDW: 14.6 % (ref 11.5–15.5)
WBC: 7.6 10*3/uL (ref 4.0–10.5)
nRBC: 0 % (ref 0.0–0.2)

## 2018-10-01 LAB — LACTATE DEHYDROGENASE: LDH: 79 U/L — ABNORMAL LOW (ref 98–192)

## 2018-10-01 NOTE — Progress Notes (Signed)
Pt here for follow up. Denies any concerns.  

## 2018-10-02 ENCOUNTER — Encounter: Payer: Self-pay | Admitting: Hematology and Oncology

## 2018-10-29 ENCOUNTER — Other Ambulatory Visit: Payer: Self-pay | Admitting: Obstetrics and Gynecology

## 2018-10-29 DIAGNOSIS — Z1231 Encounter for screening mammogram for malignant neoplasm of breast: Secondary | ICD-10-CM

## 2018-11-10 ENCOUNTER — Ambulatory Visit: Payer: Medicare Other

## 2018-11-13 ENCOUNTER — Ambulatory Visit
Admission: RE | Admit: 2018-11-13 | Discharge: 2018-11-13 | Disposition: A | Discharge status: Home / Self Care | Payer: Medicare Other | Source: Ambulatory Visit | Attending: Obstetrics and Gynecology | Admitting: Obstetrics and Gynecology

## 2018-11-13 ENCOUNTER — Other Ambulatory Visit: Payer: Self-pay

## 2018-11-13 DIAGNOSIS — Z1231 Encounter for screening mammogram for malignant neoplasm of breast: Secondary | ICD-10-CM | POA: Diagnosis not present

## 2019-01-23 IMAGING — CT CT CHEST W/ CM
2 of 3 series · 13 of 36 positions shown, 16 images · IV contrast (iopamidol)
Comparison: Right axilla ultrasound 10/27/2015

CLINICAL DATA: Lymphoma involving the lung, lymph node in left neck
and bilateral axilla, right axillary adenopathy breast ultrasound
10/27/2015

EXAM:
CT CHEST WITH CONTRAST
TECHNIQUE: Multidetector CT imaging of the chest was performed during
intravenous contrast administration.
CONTRAST:  75mL [id number] IOPAMIDOL ([id number]) INJECTION 61%

[Series 2: axial st · axial · 0.86mm/px · z∈[-328,-82]mm · 10 of 145 slices shown, 13 images]
[im 11/145  mediastinal]
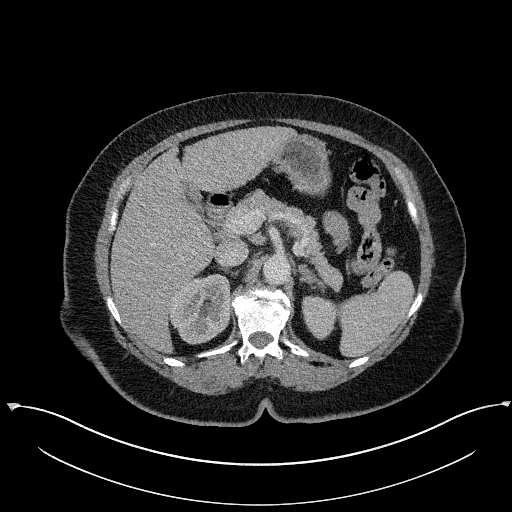
[im 11/145  lung]
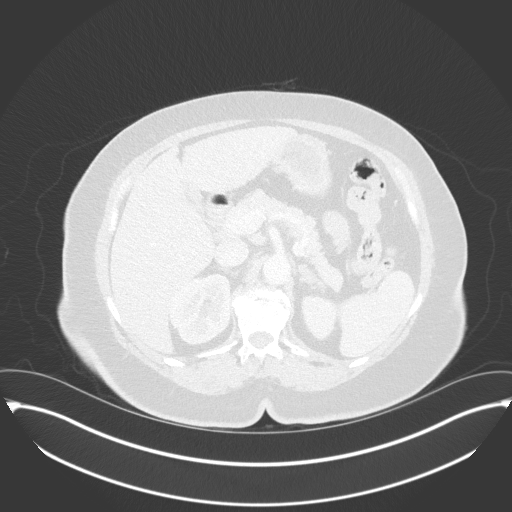
[im 22/145  lung]
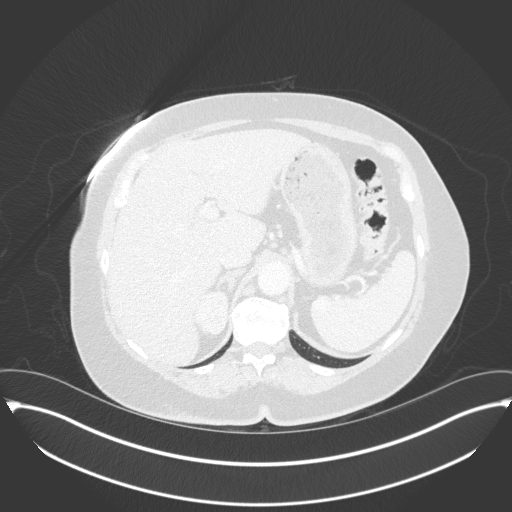
[im 38/145  lung]
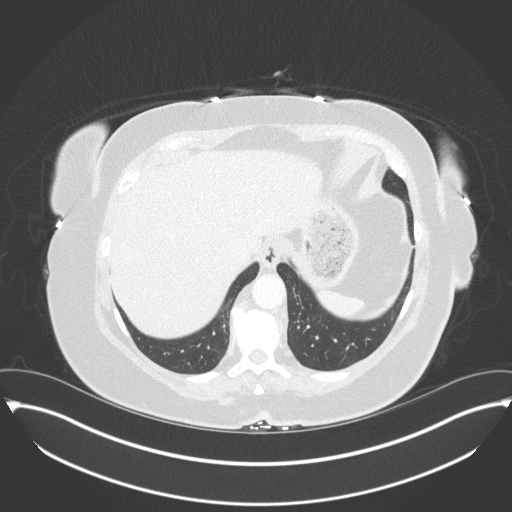
[im 54/145  lung]
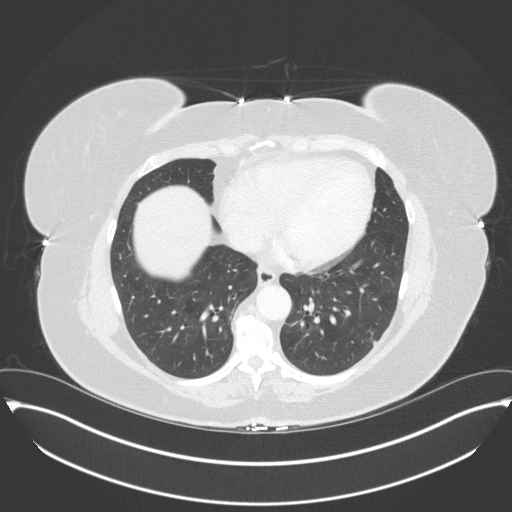
[im 65/145  mediastinal]
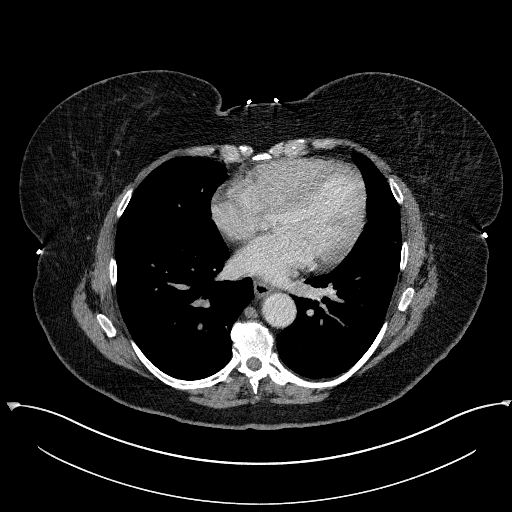
[im 65/145  lung]
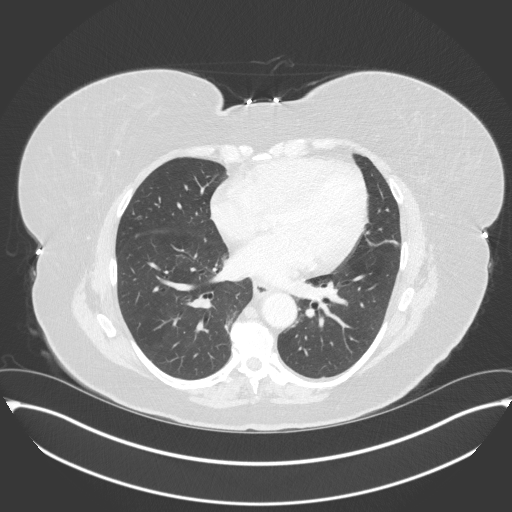
[im 81/145  lung]
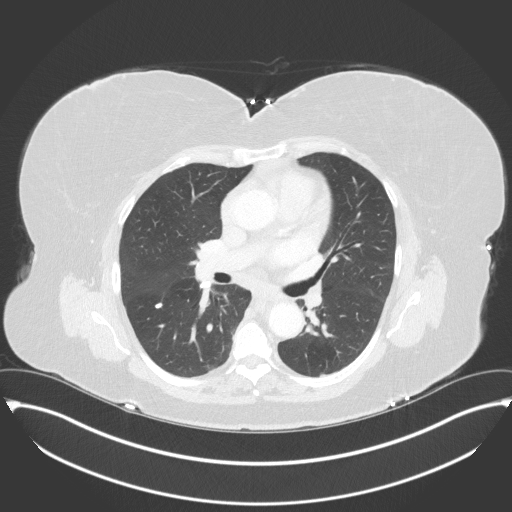
[im 91/145  lung]
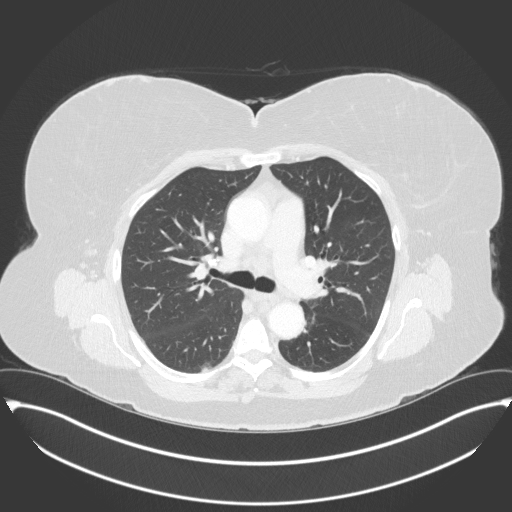
[im 107/145  lung]
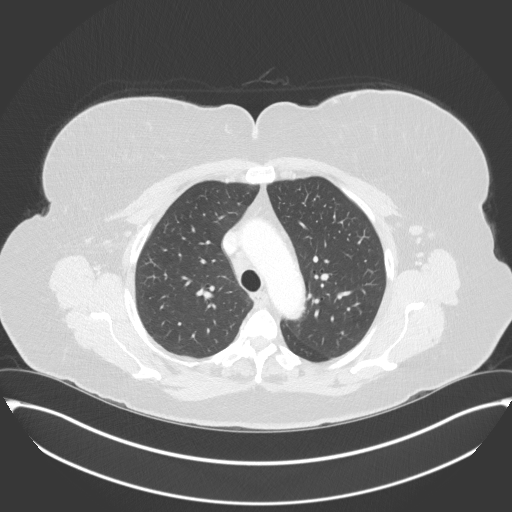
[im 123/145  mediastinal]
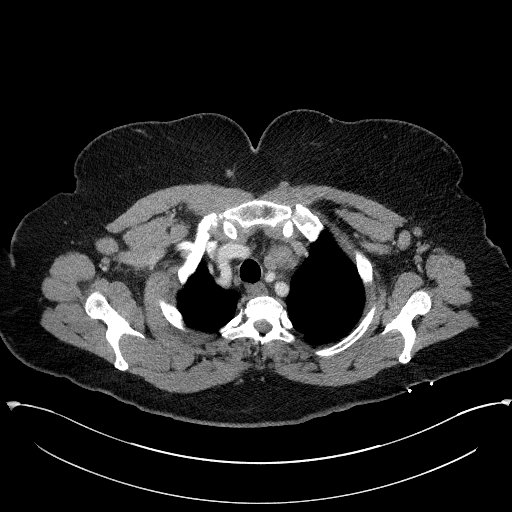
[im 123/145  lung]
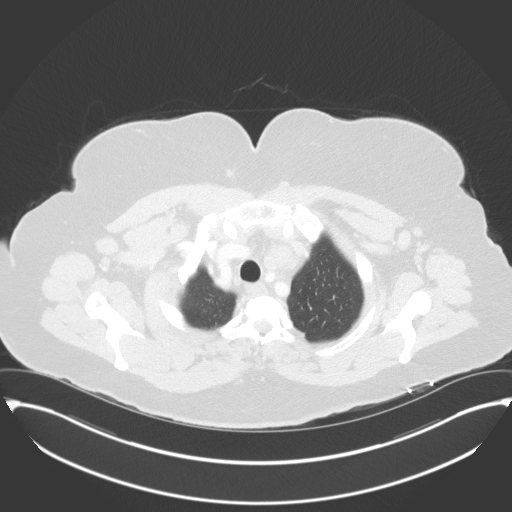
[im 134/145  lung]
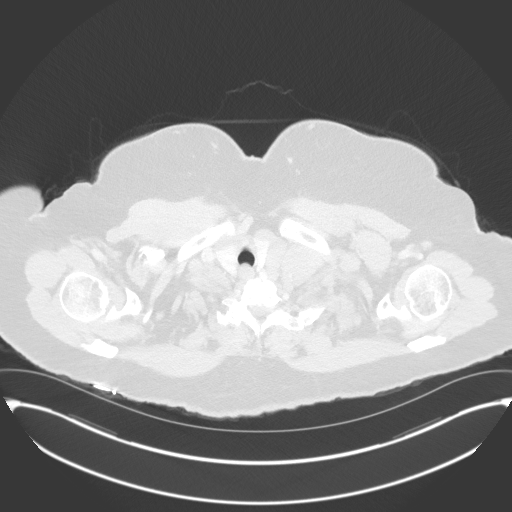

[Series 5: coronal · coronal · 0.56mm/px · 3 of 149 slices shown]
[im 30/149  lung]
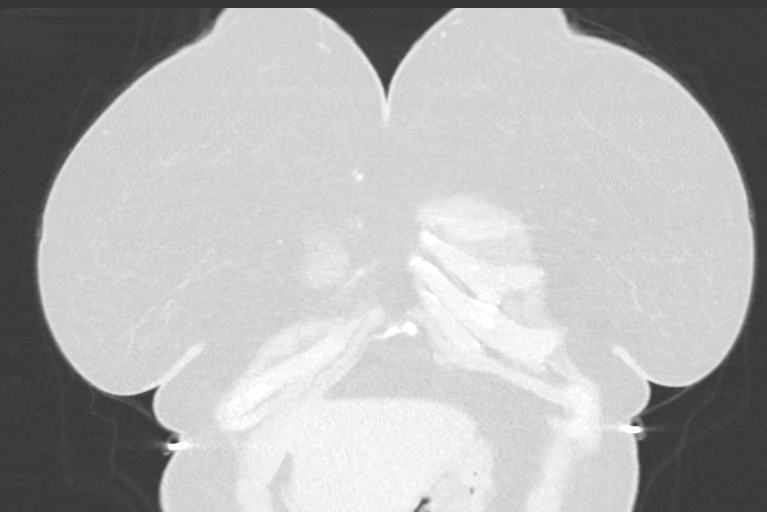
[im 60/149  lung]
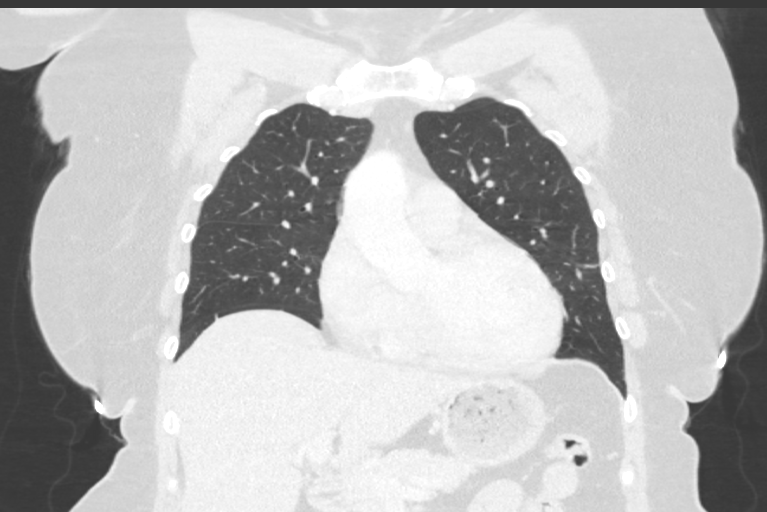
[im 89/149  lung]
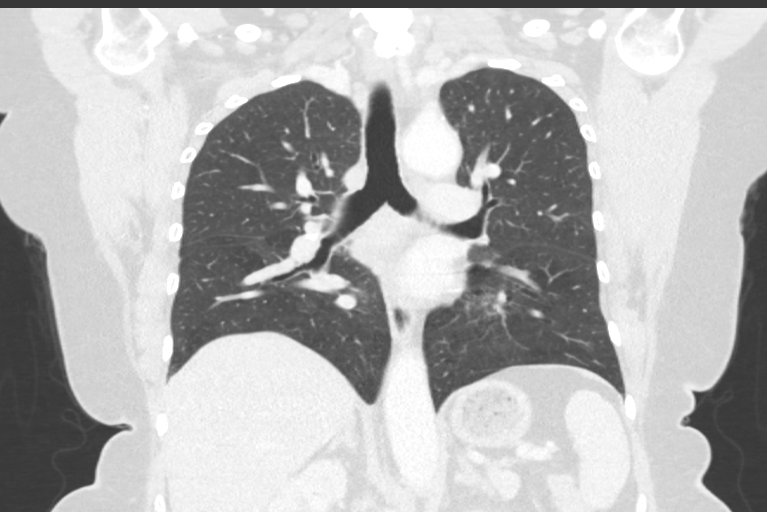

[13 of 36 positions shown; findings below may reference images not displayed]

FINDINGS: Cardiovascular: Cardiac size is unremarkable. No pleural effusion.
No aortic aneurysm. Mild atherosclerotic calcifications of coronary
arteries. Mild atherosclerotic calcifications of aortic knob.

Mediastinum/Nodes: A precarinal lymph node with a tiny calcification
measures 7.3 mm short-axis not pathologic. Calcified right hilar
lymph node the largest measures 7 mm probable prior granulomatous
disease. No sub- carinal adenopathy.

Lungs/Pleura: Images of the lung parenchyma shows no acute
infiltrate or pulmonary edema. No bronchiectasis. No fibrotic
changes. There is a calcified granuloma in right lower lobe
superolaterally measures 9 mm. No pleural plaques or pleural
thickening. Second calcified granuloma right lower lobe measures 4
mm axial image 79. No pulmonary nodules or focal consolidation.

Upper Abdomen: The visualized upper abdomen shows a large calcified
gallstone within gallbladder measures 2.2 cm. The gallbladder is
contracted. No focal hepatic mass. Tiny hiatal hernia. The
visualized pancreas and spleen is unremarkable. No adrenal gland
mass. The visualized kidneys are unremarkable.

Musculoskeletal: Consistent with history there is a left
supraclavicular enlarged lymph node measures at least 3.5 cm. Second
lymph node in left supraclavicular region just lateral to trachea
and left lobe of thyroid gland measures 2.2 cm.

There is bilateral axillary adenopathy. The largest left axillary
lymph node measures 2 cm short-axis. Again noted significant right
axillary adenopathy. The largest lymph node measures at least 3.5 by
2.2 cm. These are highly suspicious for lymphoproliferative disease
or metastatic disease. Findings may represent lymphoma. Clinical
correlation is necessary. No definite breast mass is identified.
Please correlate with patient's mammogram.

Sagittal images of the spine shows degenerative changes thoracic
spine. Sagittal view of the sternum is unremarkable. No destructive
bony lesions are noted.
IMPRESSION: 1. There is left supraclavicular and bilateral axillary adenopathy.
Highly suspicious for lymphoproliferative disease metastatic disease
or lymphoma. Further correlation with biopsy is recommended.
2. No infiltrate or pulmonary edema.
3. No mediastinal or hilar adenopathy. Calcified right hilar lymph
nodes are noted probable prior granulomatous disease. There is a
calcified granuloma in right lower lobe laterally measures 9 mm.

## 2019-03-31 ENCOUNTER — Encounter: Payer: Self-pay | Admitting: Hematology and Oncology

## 2019-03-31 NOTE — Progress Notes (Signed)
RN called for pre assessment for lab/md visit tomorrow.  Denies any concerns or difficulties today.

## 2019-04-01 ENCOUNTER — Other Ambulatory Visit: Payer: Medicare Other

## 2019-04-01 ENCOUNTER — Encounter: Payer: Medicare Other | Admitting: Hematology and Oncology

## 2019-04-01 NOTE — Progress Notes (Signed)
Confirmed Name and DOB. Denies any concerns.  

## 2019-04-01 NOTE — Progress Notes (Signed)
[facility]  [address], [address] [place], [place] [address] Phone: [phone]  Fax: [phone]   Clinic Day:  04/02/2019  Referring physician: Rusty Aus, MD  Chief Complaint: Alexandra Moore is a 68 y.o. female with follicular lymphoma  who is seen for 6 month assessment.   HPI: The patient was last seen in the medical oncology clinic on 10/01/2018. At that time, she was doing well.  She denied any B symptoms.  Exam was unremarkable.  Hemoglobin was 13.5, platelets 242,000, and WBC 7600 ([place] 4900).  Potassium was 3.0.  Bilateral mammogram on 11/13/2018 revealed no mammographic evidence of malignancy.  During the interim, she has felt "okay". She denies any B symptoms. She has no swelling or pain in her feet and legs. Her insomnia has improved. She has no joint pain in her right shoulder. She remains active. Her BP is 154/89 in the clinic today.    Past Medical History:  Diagnosis Date  . Cancer ([place])    non hodgkins lymphoma- ? in remission  . Hypertension     History reviewed. No pertinent surgical history.  Family History  Problem Relation Age of Onset  . Breast cancer Sister 83    Social History:  reports that she has never smoked. She has never used smokeless tobacco. She reports current alcohol use. She reports that she does not use drugs. She retired in 10/2015. 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 She previously lived in [place]. She moved to [place] in 1993. She lives in [place] husband is receiving chemotherapy. She has an extensive vegetable garden in her yard. The patient is alone today.  Allergies:  Allergies  Allergen Reactions  . Diphenhydramine Hcl Other (See Comments)    Chills  . Sulfa Antibiotics Hives    Current Medications: Current Outpatient Medications  Medication Sig Dispense Refill  . amLODipine (NORVASC) 5 MG tablet Take 5 mg by mouth daily.    .  Cholecalciferol (VITAMIN D3) 1000 units CAPS Take 1,000 Units by mouth daily.    Kendall Flack 575 MG/5ML SYRP     . Flaxseed Oil OIL Use once daily.    . Garlic 123XX123 MG CAPS     . GLUCOSAMINE SULFATE PO Take 1 tablet by mouth daily.    . IRON PO Take 25 mg daily by mouth.    . magnesium gluconate (MAGONATE) 500 MG tablet Take 500 mg by mouth 2 (two) times daily.    . Multiple Vitamin (MULTI-VITAMINS) TABS Take 1 tablet by mouth daily.    . Oregano 1500 MG CAPS     . Potassium 99 MG TABS Take 1 tablet by mouth 2 (two) times daily.    . Probiotic Product (PROBIOTIC-10) CAPS Take 70 mg by mouth daily.    . TURMERIC PO Take 400 mg by mouth daily.    . vitamin E 400 UNIT capsule Take 400 Units by mouth daily.     No current facility-administered medications for this visit.    Review of Systems  Constitutional: Negative for diaphoresis, fever, malaise/fatigue and weight loss (stable).       Feels "ok".  Active.  HENT: Negative.  Negative for congestion, ear pain, hearing loss, nosebleeds, sinus pain and sore throat.   Eyes: Negative.  Negative for blurred vision.  Respiratory: Negative.  Negative for cough, hemoptysis, sputum production, shortness of breath and wheezing.   Cardiovascular: Negative.  Negative for chest pain,  palpitations, orthopnea and PND.  Gastrointestinal: Negative for abdominal pain, blood in stool, constipation, diarrhea, melena, nausea and vomiting.       No prior colonoscopy - patient cites that it is "too invasive".  Genitourinary: Negative.  Negative for dysuria, frequency, hematuria and urgency.  Musculoskeletal: Negative.  Negative for back pain, falls, joint pain (right shoulder secondary to bursitis- resolved) and myalgias.  Skin: Negative.  Negative for itching and rash.  Neurological: Negative.  Negative for dizziness, tremors, sensory change, speech change, focal weakness, weakness and headaches.  Endo/Heme/Allergies: Negative.  Does not bruise/bleed easily.   Psychiatric/Behavioral: Negative for depression and memory loss. The patient is not nervous/anxious and does not have insomnia (improving).   All other systems reviewed and are negative.  Performance status (ECOG): 0  Vitals Blood pressure (!) 154/89, pulse 84, temperature (!) 96.6 F (35.9 C), temperature source Tympanic, resp. rate 18, weight 213 lb 8.3 oz (96.9 kg), SpO2 100 %.   Physical Exam  Constitutional: She is oriented to person, place, and time. She appears well-developed and well-nourished. No distress.  HENT:  Head: Normocephalic and atraumatic.  Mouth/Throat: Oropharynx is clear and moist. No oropharyngeal exudate.  Short curly brown hair.  Mask.  Eyes: Pupils are equal, round, and reactive to light. Conjunctivae and EOM are normal. No scleral icterus.  Glasses.  Brown eyes.   Neck: No JVD present.  Cardiovascular: Normal rate, regular rhythm and normal heart sounds.  No murmur heard. Pulmonary/Chest: Effort normal and breath sounds normal. No respiratory distress. She has no wheezes. She has no rales.  Abdominal: Soft. Bowel sounds are normal. She exhibits no distension and no mass. There is no hepatosplenomegaly. There is no abdominal tenderness. There is no rebound and no guarding.  Musculoskeletal:        General: No edema. Normal range of motion.     Cervical back: Normal range of motion and neck supple.     Comments: Ace bandage on right foot.  Lymphadenopathy:       Head (right side): No preauricular, no posterior auricular and no occipital adenopathy present.       Head (left side): No preauricular, no posterior auricular and no occipital adenopathy present.    She has no cervical adenopathy.    She has no axillary adenopathy.       Right: No inguinal and no supraclavicular adenopathy present.       Left: No inguinal and no supraclavicular adenopathy present.  Neurological: She is alert and oriented to person, place, and time.  Skin: Skin is warm and dry. No  rash noted. She is not diaphoretic. No erythema. No pallor.  Psychiatric: She has a normal mood and affect. Her behavior is normal. Judgment and thought content normal.  Nursing note and vitals reviewed.   Appointment on 04/02/2019  Component Date Value Ref Range Status  . WBC 04/02/2019 6.0  4.0 - 10.5 K/uL Final  . RBC 04/02/2019 4.61  3.87 - 5.11 MIL/uL Final  . Hemoglobin 04/02/2019 13.6  12.0 - 15.0 g/dL Final  . HCT 04/02/2019 39.8  36.0 - 46.0 % Final  . MCV 04/02/2019 86.3  80.0 - 100.0 fL Final  . MCH 04/02/2019 29.5  26.0 - 34.0 pg Final  . MCHC 04/02/2019 34.2  30.0 - 36.0 g/dL Final  . RDW 04/02/2019 14.4  11.5 - 15.5 % Final  . Platelets 04/02/2019 270  150 - 400 K/uL Final  . nRBC 04/02/2019 0.0  0.0 - 0.2 % Final  .  Neutrophils Relative % 04/02/2019 61  % Final  . Neutro Abs 04/02/2019 3.7  1.7 - 7.7 K/uL Final  . Lymphocytes Relative 04/02/2019 24  % Final  . Lymphs Abs 04/02/2019 1.5  0.7 - 4.0 K/uL Final  . Monocytes Relative 04/02/2019 8  % Final  . Monocytes Absolute 04/02/2019 0.5  0.1 - 1.0 K/uL Final  . Eosinophils Relative 04/02/2019 2  % Final  . Eosinophils Absolute 04/02/2019 0.1  0.0 - 0.5 K/uL Final  . Basophils Relative 04/02/2019 1  % Final  . Basophils Absolute 04/02/2019 0.0  0.0 - 0.1 K/uL Final  . Immature Granulocytes 04/02/2019 4  % Final  . Abs Immature Granulocytes 04/02/2019 0.23* 0.00 - 0.07 K/uL Final   Performed at [facility], [address] [place], [place] [address]    Assessment:  Alexandra Moore is a 68 y.o. 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 She initially developed right axillary adenopathy in 10/2015. She then developed waxing and waning left neck adenopathy. Ultrasound guided biopsy of a left supraclavicular node on 07/02/2016 revealed grade I-II follicular lymphoma.  Chest CT on 06/18/2016 revealed left supraclavicular and bilateral axillary adenopathy. The largest left axillary node measured 2 cm. The  largest right axillary node measured 3.5 x 2.2 cm. There was no mediastinal or hilar adenopathy. There was a calcified right hilar lymph nodes likely secondary to prior granulomatous disease. There was a 9 mm calcified granuloma in right lower lobe laterally.  PET scanon 07/24/2016 revealed hypermetabolic adenopathy in the neck, chest, abdomen, and pelvis, compatible with lymphoma. Index nodes included a level IIa neck node of 1.8 cm (SUV 9.7), left axillary node 1.8 cm (SUV 6.9), AP window node 1.4 cm (SUV 6.9), 1.4 cm left periaortic lymph node (SUV 7.9), 2.7 cm right external iliac node (SUV 9.2), and right inguinal node (SUV 8.9). There was mild marrow heterogeneity of activity but without a well-defined region of osseous malignant involvement.   Bone marrow aspirate and biopsyon 07/31/2016 revealed a slightly hypercellular marrow for age with atypical lymphoid aggregates and trilineage hematopoiesis. Flow cytometry revealed a minor B cell population with lambda light chain excess representing <10% of the lymphocytes with expression of pan B-cell antigens including CD20. These findings are worrisome but not definitive for minimal involvement by a B-cell lymphoproliferative process. Overall findings are consistent with involvment by non-Hodgkin's lymphoma.  Bone densityon 08/21/2017 was normal with a T-score of 0.2 in the LEFT femoral neck.    Bilateral screening mammogramon 11/04/2017 that revealed an enlarged lymph node in the RIGHT axilla. There was no mammographic evidence of malignancy in either breast.  Bilateral mammogram on 11/13/2018 revealed no mammographic evidence of malignancy.  Symptomatically, she is doing well.  She denies any B symptoms.  Plan: 1.   Labs today: CBC with diff, CMP, uric acid.  2.  Stage IVA follicular lymphoma Clinically, she is doing well. She denies any B symptoms. CBC is normal. Continue surveillance. 3.   Hyperuricemia Uric acid is 7.1  (normal). Continue surveillance. 4.   Hypokalemia             Potassium is 2.9 (avialble after appointment).             She is on potassium supplementation.             Patient and Dr Myrtis Ser office contacted.. 5.   RTC in 6 months for MD assessment and labs (CBC with diff, CMP, uric acid).  I discussed  the assessment and treatment plan with the patient.  The patient was provided an opportunity to ask questions and all were answered.  The patient agreed with the plan and demonstrated an understanding of the instructions.  The patient was advised to call back if the symptoms worsen or if the condition fails to improve as anticipated.   Lequita Asal, MD, PhD    04/02/2019, 9:29 AM  I, Selena Batten, am acting as a scribe for Lequita Asal, MD.  I, [place] Mike Gip, MD, have reviewed the above documentation for accuracy and completeness, and I agree with the above.

## 2019-04-02 ENCOUNTER — Ambulatory Visit: Payer: Medicare Other | Admitting: Hematology and Oncology

## 2019-04-02 ENCOUNTER — Telehealth: Payer: Self-pay

## 2019-04-02 ENCOUNTER — Encounter: Payer: Self-pay | Admitting: Hematology and Oncology

## 2019-04-02 ENCOUNTER — Other Ambulatory Visit: Payer: Self-pay

## 2019-04-02 ENCOUNTER — Inpatient Hospital Stay (HOSPITAL_BASED_OUTPATIENT_CLINIC_OR_DEPARTMENT_OTHER): Payer: Medicare Other | Admitting: Hematology and Oncology

## 2019-04-02 ENCOUNTER — Inpatient Hospital Stay: Payer: Medicare Other | Attending: Hematology and Oncology

## 2019-04-02 VITALS — BP 154/89 | HR 84 | Temp 96.6°F | Resp 18 | Wt 213.5 lb

## 2019-04-02 DIAGNOSIS — G4700 Insomnia, unspecified: Secondary | ICD-10-CM | POA: Insufficient documentation

## 2019-04-02 DIAGNOSIS — C8218 Follicular lymphoma grade II, lymph nodes of multiple sites: Secondary | ICD-10-CM | POA: Diagnosis present

## 2019-04-02 DIAGNOSIS — I10 Essential (primary) hypertension: Secondary | ICD-10-CM | POA: Insufficient documentation

## 2019-04-02 DIAGNOSIS — Z79899 Other long term (current) drug therapy: Secondary | ICD-10-CM | POA: Insufficient documentation

## 2019-04-02 DIAGNOSIS — E790 Hyperuricemia without signs of inflammatory arthritis and tophaceous disease: Secondary | ICD-10-CM

## 2019-04-02 DIAGNOSIS — Z803 Family history of malignant neoplasm of breast: Secondary | ICD-10-CM | POA: Diagnosis not present

## 2019-04-02 DIAGNOSIS — E876 Hypokalemia: Secondary | ICD-10-CM

## 2019-04-02 LAB — COMPREHENSIVE METABOLIC PANEL
ALT: 28 U/L (ref 0–44)
AST: 24 U/L (ref 15–41)
Albumin: 4.5 g/dL (ref 3.5–5.0)
Alkaline Phosphatase: 100 U/L (ref 38–126)
Anion gap: 10 (ref 5–15)
BUN: 20 mg/dL (ref 8–23)
CO2: 28 mmol/L (ref 22–32)
Calcium: 9 mg/dL (ref 8.9–10.3)
Chloride: 101 mmol/L (ref 98–111)
Creatinine, Ser: 0.83 mg/dL (ref 0.44–1.00)
GFR calc Af Amer: >60 mL/min (ref >60)
GFR calc non Af Amer: >60 mL/min (ref >60)
Glucose, Bld: 85 mg/dL (ref 70–99)
Potassium: 2.9 mmol/L — ABNORMAL LOW (ref 3.5–5.1)
Sodium: 139 mmol/L (ref 135–145)
Total Bilirubin: 0.7 mg/dL (ref 0.3–1.2)
Total Protein: 7.7 g/dL (ref 6.5–8.1)

## 2019-04-02 LAB — CBC WITH DIFFERENTIAL/PLATELET
Abs Immature Granulocytes: 0.23 10*3/uL — ABNORMAL HIGH (ref 0.00–0.07)
Basophils Absolute: 0 10*3/uL (ref 0.0–0.1)
Basophils Relative: 1 %
Eosinophils Absolute: 0.1 10*3/uL (ref 0.0–0.5)
Eosinophils Relative: 2 %
HCT: 39.8 % (ref 36.0–46.0)
Hemoglobin: 13.6 g/dL (ref 12.0–15.0)
Immature Granulocytes: 4 %
Lymphocytes Relative: 24 %
Lymphs Abs: 1.5 10*3/uL (ref 0.7–4.0)
MCH: 29.5 pg (ref 26.0–34.0)
MCHC: 34.2 g/dL (ref 30.0–36.0)
MCV: 86.3 fL (ref 80.0–100.0)
Monocytes Absolute: 0.5 10*3/uL (ref 0.1–1.0)
Monocytes Relative: 8 %
Neutro Abs: 3.7 10*3/uL (ref 1.7–7.7)
Neutrophils Relative %: 61 %
Platelets: 270 10*3/uL (ref 150–400)
RBC: 4.61 MIL/uL (ref 3.87–5.11)
RDW: 14.4 % (ref 11.5–15.5)
WBC: 6 10*3/uL (ref 4.0–10.5)
nRBC: 0 % (ref 0.0–0.2)

## 2019-04-02 LAB — LACTATE DEHYDROGENASE: LDH: 80 U/L — ABNORMAL LOW (ref 98–192)

## 2019-04-02 LAB — URIC ACID: Uric Acid, Serum: 7.1 mg/dL (ref 2.5–7.1)

## 2019-04-02 NOTE — Telephone Encounter (Signed)
Spoke with the patient to inform her per Dr Mike Gip her potassium is low today and need to adjusted by her PCP due to low levels today. The patient lab results has been faxed over to her PCP office. The patient was understanding and agreeable.

## 2019-04-02 NOTE — Telephone Encounter (Signed)
-----   Message from Lequita Asal, MD sent at 04/02/2019 12:16 PM EST ----- Regarding: Please call patient.  Send to PCP  Potassium is 2.9.  Patient and primary care needs to know.  Her potassium supplementation will need to be increased.   M ----- Message ----- From: Buel Ream, [facility] [place] Sent: 04/02/2019   9:20 AM EST To: Lequita Asal, MD

## 2019-04-02 NOTE — Progress Notes (Signed)
No new changes noted today. The patient name and DOB has been verified by phone. 

## 2019-04-07 ENCOUNTER — Encounter: Payer: Self-pay | Admitting: Hematology and Oncology

## 2019-04-19 NOTE — Progress Notes (Signed)
This encounter was created in error - please disregard.

## 2019-10-07 ENCOUNTER — Other Ambulatory Visit: Payer: Medicare Other

## 2019-10-07 ENCOUNTER — Ambulatory Visit: Payer: Medicare Other | Admitting: Hematology and Oncology

## 2019-10-12 NOTE — Progress Notes (Signed)
[facility]  [address], [address] [place], [place] [address] Phone: [phone]  Fax: [phone]   Clinic Day:  10/14/2019  Referring physician: Rusty Aus, MD  Chief Complaint: Alexandra Moore is a 69 y.o. female with stage IVA follicular lymphoma who is seen for 6 month assessment.   HPI: The patient was last seen in the medical oncology clinic on 04/02/2019. At that time, she was doing well and denied any B symptoms. Hematocrit was 39.8, hemoglobin 13.6, platelets 270,000, WBC 6,000. Potassium was 2.9. LDH was 80. Uric acid was 7.1.  08/19/2019 labs: Hematocrit was 42.9, hemoglobin 14.1, platelets 262,000, WBC 5,800. Potassium was 3.5. CO2 was 33.2. Alkaline phosphatase was 129.  During the interim, she has been doing well.  She denies any B symptoms. She reports that she was a little bit dizzy when she woke up this morning but felt better after she ate. She takes her blood pressure at home. Half of her diastolic BPs are in the 53Z and half are in the 80s. Denies chest pain, cough, shortness of breath, headaches, congestion. Her Cologuard test about a year ago was negative.  The patient does not plan to receive her COVID-19 vaccine.   Past Medical History:  Diagnosis Date  . Cancer ([place])    non hodgkins lymphoma- ? in remission  . Hypertension     History reviewed. No pertinent surgical history.  Family History  Problem Relation Age of Onset  . Breast cancer Sister 41    Social History:  reports that she has never smoked. She has never used smokeless tobacco. She reports current alcohol use. She reports that she does not use drugs. She retired in 10/2015. 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 She previously lived in [place]. She moved to [place] in 1993. She lives in [place] husband is receiving chemotherapy. She has an extensive vegetable garden in her yard. The patient is alone  today.  Allergies:  Allergies  Allergen Reactions  . Diphenhydramine Hcl Other (See Comments)    Chills  . Sulfa Antibiotics Hives    Current Medications: Current Outpatient Medications  Medication Sig Dispense Refill  . amLODipine (NORVASC) 5 MG tablet Take 5 mg by mouth daily.    . Cholecalciferol (VITAMIN D3) 1000 units CAPS Take 1,000 Units by mouth daily.    Kendall Flack 575 MG/5ML SYRP     . Ferrous Sulfate (IRON) 28 MG TABS Take by mouth.    . Flaxseed Oil OIL Use once daily.    . Garlic 7673 MG CAPS     . GLUCOSAMINE SULFATE PO Take 1 tablet by mouth daily.    . magnesium gluconate (MAGONATE) 500 MG tablet Take 500 mg by mouth 2 (two) times daily.    . Multiple Vitamin (MULTI-VITAMINS) TABS Take 1 tablet by mouth daily.    . Oregano 1500 MG CAPS     . Potassium 99 MG TABS Take 1 tablet by mouth 2 (two) times daily.    . Probiotic Product (PROBIOTIC-10) CAPS Take 70 mg by mouth daily.    . TURMERIC PO Take 400 mg by mouth daily.    . vitamin E 400 UNIT capsule Take 400 Units by mouth daily.     No current facility-administered medications for this visit.    Review of Systems  Constitutional: Negative for chills, diaphoresis, fever, malaise/fatigue and weight loss (stable).       Feels "ok".  Active.  HENT: Negative.  Negative for congestion, ear discharge, ear pain, hearing loss, nosebleeds, sinus pain, sore throat and tinnitus.   Eyes: Negative.  Negative for blurred vision.  Respiratory: Negative.  Negative for cough, hemoptysis, sputum production, shortness of breath and wheezing.   Cardiovascular: Negative.  Negative for chest pain, palpitations, orthopnea, leg swelling and PND.  Gastrointestinal: Negative for abdominal pain, blood in stool, constipation, diarrhea, heartburn, melena, nausea and vomiting.       No prior colonoscopy - patient cites that it is "too invasive".  Genitourinary: Negative.  Negative for dysuria, frequency, hematuria and urgency.   Musculoskeletal: Negative.  Negative for back pain, falls, joint pain and myalgias.  Skin: Negative.  Negative for itching and rash.  Neurological: Positive for dizziness (this morning, felt better after she ate). Negative for tremors, sensory change, speech change, focal weakness, weakness and headaches.  Endo/Heme/Allergies: Negative.  Does not bruise/bleed easily.  Psychiatric/Behavioral: Negative.  Negative for depression and memory loss. The patient is not nervous/anxious and does not have insomnia.   All other systems reviewed and are negative.  Performance status (ECOG): 0  Vitals Blood pressure (!) 156/97, pulse 79, temperature (!) 96.5 F (35.8 C), temperature source Tympanic, resp. rate 18, weight 207 lb 0.2 oz (93.9 kg), SpO2 98 %.   Physical Exam Vitals and nursing note reviewed.  Constitutional:      General: She is not in acute distress.    Appearance: She is well-developed. She is not diaphoretic.  HENT:     Head: Normocephalic and atraumatic.     Comments: Curly dark brown hair.  Mask.    Mouth/Throat:     Mouth: Mucous membranes are moist.     Pharynx: Oropharynx is clear.  Eyes:     General: No scleral icterus.    Conjunctiva/sclera: Conjunctivae normal.     Pupils: Pupils are equal, round, and reactive to light.     Comments: Glasses.  Brown eyes.   Neck:     Vascular: No JVD.  Cardiovascular:     Rate and Rhythm: Normal rate and regular rhythm.     Pulses: Normal pulses.     Heart sounds: Normal heart sounds. No murmur heard.   Pulmonary:     Effort: Pulmonary effort is normal. No respiratory distress.     Breath sounds: Normal breath sounds. No wheezing or rales.  Chest:     Chest wall: No tenderness.  Abdominal:     General: Bowel sounds are normal. There is no distension.     Palpations: Abdomen is soft. There is no mass.     Tenderness: There is no abdominal tenderness. There is no guarding or rebound.  Musculoskeletal:        General: No  swelling or tenderness. Normal range of motion.     Cervical back: Normal range of motion and neck supple.  Lymphadenopathy:     Head:     Right side of head: No preauricular, posterior auricular or occipital adenopathy.     Left side of head: No preauricular, posterior auricular or occipital adenopathy.     Cervical: No cervical adenopathy.     Upper Body:     Right upper body: No supraclavicular or axillary adenopathy.     Left upper body: No supraclavicular or axillary adenopathy.     Lower Body: No right inguinal adenopathy. No left inguinal adenopathy.  Skin:    General: Skin is warm and dry.     Coloration: Skin is  not pale.     Findings: No erythema or rash.  Neurological:     Mental Status: She is alert and oriented to person, place, and time.  Psychiatric:        Behavior: Behavior normal.        Thought Content: Thought content normal.        Judgment: Judgment normal.    Appointment on 10/14/2019  Component Date Value Ref Range Status  . Uric Acid, Serum 10/14/2019 6.8  2.5 - 7.1 mg/dL Final   Performed at [facility], [address] [place], [place] [address]  . WBC 10/14/2019 5.8  4.0 - 10.5 K/uL Final  . RBC 10/14/2019 4.58  3.87 - 5.11 MIL/uL Final  . Hemoglobin 10/14/2019 13.1  12.0 - 15.0 g/dL Final  . HCT 10/14/2019 39.1  36 - 46 % Final  . MCV 10/14/2019 85.4  80.0 - 100.0 fL Final  . MCH 10/14/2019 28.6  26.0 - 34.0 pg Final  . MCHC 10/14/2019 33.5  30.0 - 36.0 g/dL Final  . RDW 10/14/2019 14.4  11.5 - 15.5 % Final  . Platelets 10/14/2019 246  150 - 400 K/uL Final  . nRBC 10/14/2019 0.0  0.0 - 0.2 % Final  . Neutrophils Relative % 10/14/2019 70  % Final  . Neutro Abs 10/14/2019 4.1  1.7 - 7.7 K/uL Final  . Lymphocytes Relative 10/14/2019 21  % Final  . Lymphs Abs 10/14/2019 1.2  0.7 - 4.0 K/uL Final  . Monocytes Relative 10/14/2019 6  % Final  . Monocytes Absolute 10/14/2019 0.3  0 - 1 K/uL Final  . Eosinophils Relative 10/14/2019 2  %  Final  . Eosinophils Absolute 10/14/2019 0.1  0 - 0 K/uL Final  . Basophils Relative 10/14/2019 1  % Final  . Basophils Absolute 10/14/2019 0.0  0 - 0 K/uL Final  . Immature Granulocytes 10/14/2019 0  % Final  . Abs Immature Granulocytes 10/14/2019 0.02  0.00 - 0.07 K/uL Final   Performed at [facility], [address], [place], [place] [address]  . Sodium 10/14/2019 139  135 - 145 mmol/L Final  . Potassium 10/14/2019 3.5  3.5 - 5.1 mmol/L Final  . Chloride 10/14/2019 103  98 - 111 mmol/L Final  . CO2 10/14/2019 26  22 - 32 mmol/L Final  . Glucose, Bld 10/14/2019 113* 70 - 99 mg/dL Final   Glucose reference range applies only to samples taken after fasting for at least 8 hours.  . BUN 10/14/2019 26* 8 - 23 mg/dL Final  . Creatinine, Ser 10/14/2019 0.88  0.44 - 1.00 mg/dL Final  . Calcium 10/14/2019 9.3  8.9 - 10.3 mg/dL Final  . Total Protein 10/14/2019 7.3  6.5 - 8.1 g/dL Final  . Albumin 10/14/2019 4.5  3.5 - 5.0 g/dL Final  . AST 10/14/2019 24  15 - 41 U/L Final  . ALT 10/14/2019 25  0 - 44 U/L Final  . Alkaline Phosphatase 10/14/2019 102  38 - 126 U/L Final  . Total Bilirubin 10/14/2019 0.5  0.3 - 1.2 mg/dL Final  . GFR calc non Af Amer 10/14/2019 >60  >60 mL/min Final  . GFR calc Af Amer 10/14/2019 >60  >60 mL/min Final  . Anion gap 10/14/2019 10  5 - 15 Final   Performed at [facility], [address], [place], [place] [address]    Assessment:  Alexandra Moore is a 69 y.o. female with stage IVA follicular  lymphoma. She initially developed right axillary adenopathy in 10/2015. She then developed waxing and waning left neck adenopathy. Ultrasound guided biopsy of a left supraclavicular node on 07/02/2016 revealed grade I-II follicular lymphoma.  Chest CT on 06/18/2016 revealed left supraclavicular and bilateral axillary adenopathy. The largest left axillary node measured 2 cm. The largest right axillary node measured 3.5 x 2.2 cm. There was no  mediastinal or hilar adenopathy. There was a calcified right hilar lymph nodes likely secondary to prior granulomatous disease. There was a 9 mm calcified granuloma in right lower lobe laterally.  PET scanon 07/24/2016 revealed hypermetabolic adenopathy in the neck, chest, abdomen, and pelvis, compatible with lymphoma. Index nodes included a level IIa neck node of 1.8 cm (SUV 9.7), left axillary node 1.8 cm (SUV 6.9), AP window node 1.4 cm (SUV 6.9), 1.4 cm left periaortic lymph node (SUV 7.9), 2.7 cm right external iliac node (SUV 9.2), and right inguinal node (SUV 8.9). There was mild marrow heterogeneity of activity but without a well-defined region of osseous malignant involvement.   Bone marrow aspirate and biopsyon 07/31/2016 revealed a slightly hypercellular marrow for age with atypical lymphoid aggregates and trilineage hematopoiesis. Flow cytometry revealed a minor B cell population with lambda light chain excess representing <10% of the lymphocytes with expression of pan B-cell antigens including CD20. These findings are worrisome but not definitive for minimal involvement by a B-cell lymphoproliferative process. Overall findings are consistent with involvment by non-Hodgkin's lymphoma.  Bone densityon 08/21/2017 was normal with a T-score of 0.2 in the LEFT femoral neck.    Bilateral screening mammogramon 11/04/2017 that revealed an enlarged lymph node in the RIGHT axilla. There was no mammographic evidence of malignancy in either breast.  Bilateral mammogram on 11/13/2018 revealed no mammographic evidence of malignancy.  The patient does not plan to receive the COVID-19 vaccine.  Symptomatically, she is doing well.  She denies any B symptoms.  Exam reveals no adenopathy or hepatosplenomegaly.  Plan: 1.   Labs today: CBC with diff, CMP, uric acid.  2.  Stage IVA follicular lymphoma Clinically, she is doing well. She denies any fevers, sweats or weight loss.. Labs are  unremarkable. Consider restaging scans at next visit  Continue surveillance.  3.   Hyperuricemia Uric acid is 6.8 (normal). Continue monitoring with each visit. 4.   Hypokalemia, resolved             Potassium is 3.5. 5.     RTC in 6 months for MD assessment and labs (CBC with diff, CMP, uric acid).  I discussed the assessment and treatment plan with the patient.  The patient was provided an opportunity to ask questions and all were answered.  The patient agreed with the plan and demonstrated an understanding of the instructions.  The patient was advised to call back if the symptoms worsen or if the condition fails to improve as anticipated.   Lequita Asal, MD, PhD    10/14/2019, 11:17 AM  I, Evert Kohl, am acting as a Education administrator for Lequita Asal, MD.  I, [place] Mike Gip, MD, have reviewed the above documentation for accuracy and completeness, and I agree with the above.

## 2019-10-14 ENCOUNTER — Inpatient Hospital Stay: Payer: Medicare PPO | Admitting: Hematology and Oncology

## 2019-10-14 ENCOUNTER — Other Ambulatory Visit: Payer: Self-pay

## 2019-10-14 ENCOUNTER — Encounter: Payer: Self-pay | Admitting: Hematology and Oncology

## 2019-10-14 ENCOUNTER — Inpatient Hospital Stay: Payer: Medicare PPO | Attending: Hematology and Oncology

## 2019-10-14 VITALS — BP 156/97 | HR 79 | Temp 96.5°F | Resp 18 | Wt 207.0 lb

## 2019-10-14 DIAGNOSIS — E790 Hyperuricemia without signs of inflammatory arthritis and tophaceous disease: Secondary | ICD-10-CM | POA: Diagnosis not present

## 2019-10-14 DIAGNOSIS — C8218 Follicular lymphoma grade II, lymph nodes of multiple sites: Secondary | ICD-10-CM

## 2019-10-14 DIAGNOSIS — Z803 Family history of malignant neoplasm of breast: Secondary | ICD-10-CM | POA: Diagnosis not present

## 2019-10-14 LAB — COMPREHENSIVE METABOLIC PANEL
ALT: 25 U/L (ref 0–44)
AST: 24 U/L (ref 15–41)
Albumin: 4.5 g/dL (ref 3.5–5.0)
Alkaline Phosphatase: 102 U/L (ref 38–126)
Anion gap: 10 (ref 5–15)
BUN: 26 mg/dL — ABNORMAL HIGH (ref 8–23)
CO2: 26 mmol/L (ref 22–32)
Calcium: 9.3 mg/dL (ref 8.9–10.3)
Chloride: 103 mmol/L (ref 98–111)
Creatinine, Ser: 0.88 mg/dL (ref 0.44–1.00)
GFR calc Af Amer: >60 mL/min (ref >60)
GFR calc non Af Amer: >60 mL/min (ref >60)
Glucose, Bld: 113 mg/dL — ABNORMAL HIGH (ref 70–99)
Potassium: 3.5 mmol/L (ref 3.5–5.1)
Sodium: 139 mmol/L (ref 135–145)
Total Bilirubin: 0.5 mg/dL (ref 0.3–1.2)
Total Protein: 7.3 g/dL (ref 6.5–8.1)

## 2019-10-14 LAB — CBC WITH DIFFERENTIAL/PLATELET
Abs Immature Granulocytes: 0.02 10*3/uL (ref 0.00–0.07)
Basophils Absolute: 0 10*3/uL (ref 0.0–0.1)
Basophils Relative: 1 %
Eosinophils Absolute: 0.1 10*3/uL (ref 0.0–0.5)
Eosinophils Relative: 2 %
HCT: 39.1 % (ref 36.0–46.0)
Hemoglobin: 13.1 g/dL (ref 12.0–15.0)
Immature Granulocytes: 0 %
Lymphocytes Relative: 21 %
Lymphs Abs: 1.2 10*3/uL (ref 0.7–4.0)
MCH: 28.6 pg (ref 26.0–34.0)
MCHC: 33.5 g/dL (ref 30.0–36.0)
MCV: 85.4 fL (ref 80.0–100.0)
Monocytes Absolute: 0.3 10*3/uL (ref 0.1–1.0)
Monocytes Relative: 6 %
Neutro Abs: 4.1 10*3/uL (ref 1.7–7.7)
Neutrophils Relative %: 70 %
Platelets: 246 10*3/uL (ref 150–400)
RBC: 4.58 MIL/uL (ref 3.87–5.11)
RDW: 14.4 % (ref 11.5–15.5)
WBC: 5.8 10*3/uL (ref 4.0–10.5)
nRBC: 0 % (ref 0.0–0.2)

## 2019-10-14 LAB — URIC ACID: Uric Acid, Serum: 6.8 mg/dL (ref 2.5–7.1)

## 2019-10-14 NOTE — Progress Notes (Signed)
Dizzy when she woke up today but felt better after she ate.

## 2019-10-16 ENCOUNTER — Encounter: Payer: Self-pay | Admitting: Hematology and Oncology

## 2019-10-19 ENCOUNTER — Encounter: Payer: Self-pay | Admitting: Hematology and Oncology

## 2019-12-01 ENCOUNTER — Other Ambulatory Visit: Payer: Self-pay | Admitting: Obstetrics and Gynecology

## 2019-12-01 DIAGNOSIS — Z1231 Encounter for screening mammogram for malignant neoplasm of breast: Secondary | ICD-10-CM

## 2019-12-23 ENCOUNTER — Other Ambulatory Visit: Payer: Self-pay

## 2019-12-23 ENCOUNTER — Ambulatory Visit
Admission: RE | Admit: 2019-12-23 | Discharge: 2019-12-23 | Disposition: A | Discharge status: Home / Self Care | Payer: Medicare PPO | Source: Ambulatory Visit | Attending: Obstetrics and Gynecology | Admitting: Obstetrics and Gynecology

## 2019-12-23 DIAGNOSIS — Z1231 Encounter for screening mammogram for malignant neoplasm of breast: Secondary | ICD-10-CM | POA: Diagnosis present

## 2020-01-04 ENCOUNTER — Ambulatory Visit: Payer: Medicare PPO | Attending: Critical Care Medicine

## 2020-01-04 DIAGNOSIS — Z23 Encounter for immunization: Secondary | ICD-10-CM

## 2020-01-04 NOTE — Progress Notes (Signed)
   Covid-19 Vaccination Clinic  Name:  Alexandra Moore    MRN: [id number] DOB: 31-Jul-1950  01/04/2020  Alexandra Moore was observed post Covid-19 immunization for 15 minutes without incident. She was provided with Vaccine Information Sheet and instruction to access the V-Safe system.   Alexandra Moore was instructed to call 911 with any severe reactions post vaccine: Marland Kitchen Difficulty breathing  . Swelling of face and throat  . A fast heartbeat  . A bad rash all over body  . Dizziness and weakness   Immunizations Administered    Name Date Dose VIS Date Route   Pfizer COVID-19 Vaccine 01/04/2020  9:43 AM 0.3 mL 06/17/2018 Intramuscular   Manufacturer: [place]   Lot: [id number]   [place]: [address]-8

## 2020-02-01 ENCOUNTER — Ambulatory Visit: Payer: Medicare PPO | Attending: Critical Care Medicine

## 2020-02-01 DIAGNOSIS — Z23 Encounter for immunization: Secondary | ICD-10-CM

## 2020-02-01 NOTE — Progress Notes (Signed)
   Covid-19 Vaccination Clinic  Name:  Alexandra Moore    MRN: [id number] DOB: 1950/05/22  02/01/2020  [name] was observed post Covid-19 immunization for 15 minutes without incident. She was provided with Vaccine Information Sheet and instruction to access the V-Safe system.   [name] was instructed to call 911 with any severe reactions post vaccine: Marland Kitchen Difficulty breathing  . Swelling of face and throat  . A fast heartbeat  . A bad rash all over body  . Dizziness and weakness   Immunizations Administered    Name Date Dose VIS Date Route   Moderna COVID-19 Vaccine 02/01/2020  9:49 AM 0.5 mL 03/2019 Intramuscular   Manufacturer: Moderna   Lot: [id number]   [place]: [id number]

## 2020-04-19 ENCOUNTER — Other Ambulatory Visit: Payer: Medicare PPO

## 2020-04-19 ENCOUNTER — Ambulatory Visit: Payer: Medicare PPO | Admitting: Hematology and Oncology

## 2020-04-19 NOTE — Progress Notes (Signed)
[facility]  [address] [place],  [address] Phone: [phone]  Fax: [phone]   Clinic Day:  04/20/2020  Referring physician: Rusty Aus, MD  Chief Complaint: Alexandra Moore is a 69 y.o. female with stage IVA follicular lymphoma who is seen for 6 month assessment.   HPI: The patient was last seen in the medical oncology clinic on 10/14/2019. At that time, she was doing well.  She denied any B symptoms.  Exam revealed no adenopathy or hepatosplenomegaly. Hematocrit was 39.1, hemoglobin 13.1, platelets 246,000, WBC 5,800. Uric acid was 6.8. We discussed continued surveillance.  Bilateral screening mammogram on 12/23/2019 revealed no evidence of malignancy.  During the interim, she has been good. Her dizziness has resolved. Her weight fluctuates. She tends to lose weight during the summer and gain in the winter.   She had a nodule removed from her vaginal entrance and she is following up with OBGYN. Biopsy on 01/04/2020 showed fragments of vaginal tissue with mild chronic inflammation and koilocytosis suggestive of HPV effect.  Cologuard was negative on 08/26/2018.  She takes oral potassium and eats bananas.   Past Medical History:  Diagnosis Date  . Cancer ([place])    non hodgkins lymphoma- ? in remission  . Hypertension     History reviewed. No pertinent surgical history.  Family History  Problem Relation Age of Onset  . Breast cancer Sister 43    Social History:  reports that she has never smoked. She has never used smokeless tobacco. She reports current alcohol use. She reports that she does not use drugs. She retired in 10/2015. 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 She previously lived in [place]. She moved to [place] in 1993. She lives in [place] husband is receiving chemotherapy. She has an extensive vegetable garden in her yard. The patient is alone  today.  Allergies:  Allergies  Allergen Reactions  . Diphenhydramine Hcl Other (See Comments)    Chills  . Sulfa Antibiotics Hives    Current Medications: Current Outpatient Medications  Medication Sig Dispense Refill  . amLODipine (NORVASC) 5 MG tablet Take 5 mg by mouth daily.    . Cholecalciferol (VITAMIN D3) 1000 units CAPS Take 1,000 Units by mouth daily.    Kendall Flack 575 MG/5ML SYRP     . Ferrous Sulfate (IRON) 28 MG TABS Take by mouth.    . Flaxseed Oil OIL Use once daily.    . Garlic 123XX123 MG CAPS     . GLUCOSAMINE SULFATE PO Take 1 tablet by mouth daily.    . magnesium gluconate (MAGONATE) 500 MG tablet Take 500 mg by mouth 2 (two) times daily.    . Multiple Vitamin (MULTI-VITAMINS) TABS Take 1 tablet by mouth daily.    . Oregano 1500 MG CAPS     . Potassium 99 MG TABS Take 1 tablet by mouth 2 (two) times daily.    . Probiotic Product (PROBIOTIC-10) CAPS Take 70 mg by mouth daily.    . TURMERIC PO Take 400 mg by mouth daily.    . vitamin E 400 UNIT capsule Take 400 Units by mouth daily.     No current facility-administered medications for this visit.    Review of Systems  Constitutional: Negative for chills, diaphoresis, fever, malaise/fatigue and weight loss (up 5 lbs).       Feels "fine".  HENT: Negative.  Negative for congestion, ear discharge, ear pain, hearing loss,  nosebleeds, sinus pain, sore throat and tinnitus.   Eyes: Negative.  Negative for blurred vision.  Respiratory: Negative.  Negative for cough, hemoptysis, sputum production, shortness of breath and wheezing.   Cardiovascular: Negative.  Negative for chest pain, palpitations, orthopnea, leg swelling and PND.  Gastrointestinal: Negative for abdominal pain, blood in stool, constipation, diarrhea, heartburn, melena, nausea and vomiting.  Genitourinary: Negative.  Negative for dysuria, frequency, hematuria and urgency.  Musculoskeletal: Negative.  Negative for back pain, falls, joint pain and myalgias.   Skin: Negative.  Negative for itching and rash.  Neurological: Negative for dizziness, tingling, sensory change, weakness and headaches.  Endo/Heme/Allergies: Negative.  Does not bruise/bleed easily.  Psychiatric/Behavioral: Negative.  Negative for depression and memory loss. The patient is not nervous/anxious and does not have insomnia.   All other systems reviewed and are negative.  Performance status (ECOG): 0  Vital Signs Blood pressure 130/82, pulse 84, temperature (!) 97.3 F (36.3 C), temperature source Tympanic, resp. rate 16, weight 212 lb 10.1 oz (96.5 kg), SpO2 98 %.   Physical Exam Vitals and nursing note reviewed.  Constitutional:      General: She is not in acute distress.    Appearance: She is well-developed. She is not diaphoretic.  HENT:     Head: Normocephalic and atraumatic.     Comments: Curly dark brown hair.  Mask.    Mouth/Throat:     Mouth: Mucous membranes are moist.     Pharynx: Oropharynx is clear.  Eyes:     General: No scleral icterus.    Conjunctiva/sclera: Conjunctivae normal.     Pupils: Pupils are equal, round, and reactive to light.     Comments: Glasses.  Brown eyes.   Neck:     Vascular: No JVD.  Cardiovascular:     Rate and Rhythm: Normal rate and regular rhythm.     Pulses: Normal pulses.     Heart sounds: Normal heart sounds. No murmur heard.   Pulmonary:     Effort: Pulmonary effort is normal. No respiratory distress.     Breath sounds: Normal breath sounds. No wheezing or rales.  Chest:     Chest wall: No tenderness.  Breasts:     Right: No axillary adenopathy or supraclavicular adenopathy.     Left: No axillary adenopathy or supraclavicular adenopathy.    Abdominal:     General: Bowel sounds are normal. There is no distension.     Palpations: Abdomen is soft. There is no mass.     Tenderness: There is no abdominal tenderness. There is no guarding or rebound.  Musculoskeletal:        General: No swelling or tenderness.  Normal range of motion.     Cervical back: Normal range of motion and neck supple.  Lymphadenopathy:     Head:     Right side of head: No preauricular, posterior auricular or occipital adenopathy.     Left side of head: No preauricular, posterior auricular or occipital adenopathy.     Cervical: No cervical adenopathy.     Upper Body:     Right upper body: No supraclavicular or axillary adenopathy.     Left upper body: No supraclavicular or axillary adenopathy.     Lower Body: No right inguinal adenopathy. No left inguinal adenopathy.  Skin:    General: Skin is warm and dry.     Coloration: Skin is not pale.     Findings: No erythema or rash.  Neurological:     Mental  Status: She is alert and oriented to person, place, and time.  Psychiatric:        Behavior: Behavior normal.        Thought Content: Thought content normal.        Judgment: Judgment normal.    Appointment on 04/20/2020  Component Date Value Ref Range Status  . Uric Acid, Serum 04/20/2020 6.3  2.5 - 7.1 mg/dL Final   Performed at [facility], [address], [place], [place] [address]  . WBC 04/20/2020 6.6  4.0 - 10.5 K/uL Final  . RBC 04/20/2020 4.86  3.87 - 5.11 MIL/uL Final  . Hemoglobin 04/20/2020 14.0  12.0 - 15.0 g/dL Final  . HCT 04/20/2020 42.2  36.0 - 46.0 % Final  . MCV 04/20/2020 86.8  80.0 - 100.0 fL Final  . MCH 04/20/2020 28.8  26.0 - 34.0 pg Final  . MCHC 04/20/2020 33.2  30.0 - 36.0 g/dL Final  . RDW 04/20/2020 14.4  11.5 - 15.5 % Final  . Platelets 04/20/2020 316  150 - 400 K/uL Final  . nRBC 04/20/2020 0.0  0.0 - 0.2 % Final  . Neutrophils Relative % 04/20/2020 59  % Final  . Neutro Abs 04/20/2020 3.9  1.7 - 7.7 K/uL Final  . Lymphocytes Relative 04/20/2020 31  % Final  . Lymphs Abs 04/20/2020 2.1  0.7 - 4.0 K/uL Final  . Monocytes Relative 04/20/2020 7  % Final  . Monocytes Absolute 04/20/2020 0.5  0.1 - 1.0 K/uL Final  . Eosinophils Relative 04/20/2020 2  % Final  .  Eosinophils Absolute 04/20/2020 0.1  0.0 - 0.5 K/uL Final  . Basophils Relative 04/20/2020 1  % Final  . Basophils Absolute 04/20/2020 0.0  0.0 - 0.1 K/uL Final  . Immature Granulocytes 04/20/2020 0  % Final  . Abs Immature Granulocytes 04/20/2020 0.01  0.00 - 0.07 K/uL Final   Performed at [facility], [address], [place], [place] [address]  . Sodium 04/20/2020 141  135 - 145 mmol/L Final  . Potassium 04/20/2020 3.1* 3.5 - 5.1 mmol/L Final  . Chloride 04/20/2020 100  98 - 111 mmol/L Final  . CO2 04/20/2020 31  22 - 32 mmol/L Final  . Glucose, Bld 04/20/2020 93  70 - 99 mg/dL Final   Glucose reference range applies only to samples taken after fasting for at least 8 hours.  . BUN 04/20/2020 19  8 - 23 mg/dL Final  . Creatinine, Ser 04/20/2020 0.78  0.44 - 1.00 mg/dL Final  . Calcium 04/20/2020 9.3  8.9 - 10.3 mg/dL Final  . Total Protein 04/20/2020 7.2  6.5 - 8.1 g/dL Final  . Albumin 04/20/2020 4.5  3.5 - 5.0 g/dL Final  . AST 04/20/2020 22  15 - 41 U/L Final  . ALT 04/20/2020 26  0 - 44 U/L Final  . Alkaline Phosphatase 04/20/2020 90  38 - 126 U/L Final  . Total Bilirubin 04/20/2020 0.6  0.3 - 1.2 mg/dL Final  . GFR, Estimated 04/20/2020 >60  >60 mL/min Final   Comment: (NOTE) Calculated using the CKD-EPI Creatinine Equation (2021)   . Anion gap 04/20/2020 10  5 - 15 Final   Performed at [facility], [address], [place], [place] [address]    Assessment:  Alexandra Moore is a 69 y.o. 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 She initially developed right axillary adenopathy in 10/2015. She then developed waxing and waning left neck adenopathy. Ultrasound guided  biopsy of a left supraclavicular node on 07/02/2016 revealed grade I-II follicular lymphoma.  Chest CT on 06/18/2016 revealed left supraclavicular and bilateral axillary adenopathy. The largest left axillary node measured 2 cm. The largest right axillary node measured 3.5 x 2.2 cm. There  was no mediastinal or hilar adenopathy. There was a calcified right hilar lymph nodes likely secondary to prior granulomatous disease. There was a 9 mm calcified granuloma in right lower lobe laterally.  PET scanon 07/24/2016 revealed hypermetabolic adenopathy in the neck, chest, abdomen, and pelvis, compatible with lymphoma. Index nodes included a level IIa neck node of 1.8 cm (SUV 9.7), left axillary node 1.8 cm (SUV 6.9), AP window node 1.4 cm (SUV 6.9), 1.4 cm left periaortic lymph node (SUV 7.9), 2.7 cm right external iliac node (SUV 9.2), and right inguinal node (SUV 8.9). There was mild marrow heterogeneity of activity but without a well-defined region of osseous malignant involvement.   Bone marrow aspirate and biopsyon 07/31/2016 revealed a slightly hypercellular marrow for age with atypical lymphoid aggregates and trilineage hematopoiesis. Flow cytometry revealed a minor B cell population with lambda light chain excess representing <10% of the lymphocytes with expression of pan B-cell antigens including CD20. These findings are worrisome but not definitive for minimal involvement by a B-cell lymphoproliferative process. Overall findings are consistent with involvment by non-Hodgkin's lymphoma.  Bone densityon 08/21/2017 was normal with a T-score of 0.2 in the LEFT femoral neck.    Bilateral screening mammogramon 11/04/2017 that revealed an enlarged lymph node in the RIGHT axilla. There was no mammographic evidence of malignancy in either breast.  Bilateral mammogram on 11/13/2018 revealed no mammographic evidence of malignancy.  Bilateral screening mammogram on 12/23/2019 revealed no evidence of malignancy.  The patient received the Moderna COVID-19 vaccine on 01/04/2020 and 02/01/2020.  Cologuard was negative on 08/26/2018.    Symptomatically, she feels good.  Her weight fluctuates.  He denies any fevers or sweats.  Exam reveals no adenopathy or hepatosplenomegaly.  Plan: 1.    Labs today: CBC with diff, CMP, uric acid. 2.   Stage IVA follicular lymphoma Clinically, she continues to do well. She denies any B symptoms. Labs remain unremarkable. Continue surveillance.  3.   Hyperuricemia Uric acid  is 6.3 (normal). [place] with each visit. 4.   Hypokalemia             Potassium is 3.1.  Labs faxed to Dr. Sabra Heck for supplementation 5.   RN:  Call patient with CMP results (when back). 6.   RTC in 6 months for MD assessment and labs (CBC with diff, CMP, uric acid).  I discussed the assessment and treatment plan with the patient.  The patient was provided an opportunity to ask questions and all were answered.  The patient agreed with the plan and demonstrated an understanding of the instructions.  The patient was advised to call back if the symptoms worsen or if the condition fails to improve as anticipated.  I provided 17 minutes of face-to-face time during this this encounter and > 50% was spent counseling as documented under my assessment and plan.  An additional 5 minutes were spent reviewing her chart (Epic and Care Everywhere) including notes, labs, and imaging studies.    Lequita Asal, MD, PhD    04/20/2020, 11:07 AM  I, Evert Kohl, am acting as a Education administrator for Lequita Asal, MD.  I, Kennett Mike Gip, MD, have reviewed the above documentation for accuracy and completeness, and I agree  with the above.

## 2020-04-20 ENCOUNTER — Encounter: Payer: Self-pay | Admitting: Hematology and Oncology

## 2020-04-20 ENCOUNTER — Telehealth: Payer: Self-pay

## 2020-04-20 ENCOUNTER — Inpatient Hospital Stay: Payer: Medicare PPO

## 2020-04-20 ENCOUNTER — Inpatient Hospital Stay: Payer: Medicare PPO | Attending: Hematology and Oncology | Admitting: Hematology and Oncology

## 2020-04-20 ENCOUNTER — Other Ambulatory Visit: Payer: Self-pay

## 2020-04-20 VITALS — BP 130/82 | HR 84 | Temp 97.3°F | Resp 16 | Wt 212.6 lb

## 2020-04-20 DIAGNOSIS — C8218 Follicular lymphoma grade II, lymph nodes of multiple sites: Secondary | ICD-10-CM

## 2020-04-20 DIAGNOSIS — Z803 Family history of malignant neoplasm of breast: Secondary | ICD-10-CM | POA: Diagnosis not present

## 2020-04-20 DIAGNOSIS — E790 Hyperuricemia without signs of inflammatory arthritis and tophaceous disease: Secondary | ICD-10-CM | POA: Diagnosis not present

## 2020-04-20 LAB — CBC WITH DIFFERENTIAL/PLATELET
Abs Immature Granulocytes: 0.01 10*3/uL (ref 0.00–0.07)
Basophils Absolute: 0 10*3/uL (ref 0.0–0.1)
Basophils Relative: 1 %
Eosinophils Absolute: 0.1 10*3/uL (ref 0.0–0.5)
Eosinophils Relative: 2 %
HCT: 42.2 % (ref 36.0–46.0)
Hemoglobin: 14 g/dL (ref 12.0–15.0)
Immature Granulocytes: 0 %
Lymphocytes Relative: 31 %
Lymphs Abs: 2.1 10*3/uL (ref 0.7–4.0)
MCH: 28.8 pg (ref 26.0–34.0)
MCHC: 33.2 g/dL (ref 30.0–36.0)
MCV: 86.8 fL (ref 80.0–100.0)
Monocytes Absolute: 0.5 10*3/uL (ref 0.1–1.0)
Monocytes Relative: 7 %
Neutro Abs: 3.9 10*3/uL (ref 1.7–7.7)
Neutrophils Relative %: 59 %
Platelets: 316 10*3/uL (ref 150–400)
RBC: 4.86 MIL/uL (ref 3.87–5.11)
RDW: 14.4 % (ref 11.5–15.5)
WBC: 6.6 10*3/uL (ref 4.0–10.5)
nRBC: 0 % (ref 0.0–0.2)

## 2020-04-20 LAB — COMPREHENSIVE METABOLIC PANEL
ALT: 26 U/L (ref 0–44)
AST: 22 U/L (ref 15–41)
Albumin: 4.5 g/dL (ref 3.5–5.0)
Alkaline Phosphatase: 90 U/L (ref 38–126)
Anion gap: 10 (ref 5–15)
BUN: 19 mg/dL (ref 8–23)
CO2: 31 mmol/L (ref 22–32)
Calcium: 9.3 mg/dL (ref 8.9–10.3)
Chloride: 100 mmol/L (ref 98–111)
Creatinine, Ser: 0.78 mg/dL (ref 0.44–1.00)
GFR, Estimated: >60 mL/min (ref >60)
Glucose, Bld: 93 mg/dL (ref 70–99)
Potassium: 3.1 mmol/L — ABNORMAL LOW (ref 3.5–5.1)
Sodium: 141 mmol/L (ref 135–145)
Total Bilirubin: 0.6 mg/dL (ref 0.3–1.2)
Total Protein: 7.2 g/dL (ref 6.5–8.1)

## 2020-04-20 LAB — URIC ACID: Uric Acid, Serum: 6.3 mg/dL (ref 2.5–7.1)

## 2020-04-20 NOTE — Telephone Encounter (Signed)
-----   Message from [place], [place] sent at 04/20/2020 11:21 AM EST ----- Regarding: FW: Please call patient  ----- Message ----- From: Rosey Bath, MD Sent: 04/20/2020  11:16 AM EST To: Bronwen Betters, CMA Subject: Please call patient                             Potassium is 3.1 (low).  She will need to be on supplemental potassium.  Please send to her PCP and contact them for supplementation (She intermittently has a low potassium).  M  ----- Message ----- From: Leory Plowman, [facility] [place] Sent: 04/20/2020  10:17 AM EST To: Rosey Bath, MD

## 2020-04-20 NOTE — Progress Notes (Signed)
No new changes noted today 

## 2020-10-16 ENCOUNTER — Other Ambulatory Visit: Payer: Self-pay

## 2020-10-16 DIAGNOSIS — E876 Hypokalemia: Secondary | ICD-10-CM

## 2020-10-16 DIAGNOSIS — C8218 Follicular lymphoma grade II, lymph nodes of multiple sites: Secondary | ICD-10-CM

## 2020-10-16 DIAGNOSIS — E790 Hyperuricemia without signs of inflammatory arthritis and tophaceous disease: Secondary | ICD-10-CM

## 2020-10-19 ENCOUNTER — Inpatient Hospital Stay: Payer: Medicare PPO | Attending: Oncology

## 2020-10-19 ENCOUNTER — Inpatient Hospital Stay: Payer: Medicare PPO | Admitting: Oncology

## 2020-10-19 ENCOUNTER — Other Ambulatory Visit: Payer: Self-pay

## 2020-10-19 VITALS — BP 137/69 | HR 70 | Temp 96.5°F | Resp 18 | Wt 199.8 lb

## 2020-10-19 DIAGNOSIS — E876 Hypokalemia: Secondary | ICD-10-CM | POA: Diagnosis not present

## 2020-10-19 DIAGNOSIS — Z803 Family history of malignant neoplasm of breast: Secondary | ICD-10-CM | POA: Diagnosis not present

## 2020-10-19 DIAGNOSIS — C8218 Follicular lymphoma grade II, lymph nodes of multiple sites: Secondary | ICD-10-CM | POA: Insufficient documentation

## 2020-10-19 DIAGNOSIS — E790 Hyperuricemia without signs of inflammatory arthritis and tophaceous disease: Secondary | ICD-10-CM

## 2020-10-19 LAB — CBC WITH DIFFERENTIAL/PLATELET
Abs Immature Granulocytes: 0.01 10*3/uL (ref 0.00–0.07)
Basophils Absolute: 0 10*3/uL (ref 0.0–0.1)
Basophils Relative: 0 %
Eosinophils Absolute: 0.1 10*3/uL (ref 0.0–0.5)
Eosinophils Relative: 3 %
HCT: 41.5 % (ref 36.0–46.0)
Hemoglobin: 14 g/dL (ref 12.0–15.0)
Immature Granulocytes: 0 %
Lymphocytes Relative: 31 %
Lymphs Abs: 1.7 10*3/uL (ref 0.7–4.0)
MCH: 29.2 pg (ref 26.0–34.0)
MCHC: 33.7 g/dL (ref 30.0–36.0)
MCV: 86.5 fL (ref 80.0–100.0)
Monocytes Absolute: 0.4 10*3/uL (ref 0.1–1.0)
Monocytes Relative: 7 %
Neutro Abs: 3.2 10*3/uL (ref 1.7–7.7)
Neutrophils Relative %: 59 %
Platelets: 281 10*3/uL (ref 150–400)
RBC: 4.8 MIL/uL (ref 3.87–5.11)
RDW: 14.6 % (ref 11.5–15.5)
WBC: 5.5 10*3/uL (ref 4.0–10.5)
nRBC: 0 % (ref 0.0–0.2)

## 2020-10-19 LAB — COMPREHENSIVE METABOLIC PANEL
ALT: 23 U/L (ref 0–44)
AST: 21 U/L (ref 15–41)
Albumin: 4.8 g/dL (ref 3.5–5.0)
Alkaline Phosphatase: 98 U/L (ref 38–126)
Anion gap: 8 (ref 5–15)
BUN: 21 mg/dL (ref 8–23)
CO2: 31 mmol/L (ref 22–32)
Calcium: 9 mg/dL (ref 8.9–10.3)
Chloride: 100 mmol/L (ref 98–111)
Creatinine, Ser: 0.72 mg/dL (ref 0.44–1.00)
GFR, Estimated: >60 mL/min (ref >60)
Glucose, Bld: 85 mg/dL (ref 70–99)
Potassium: 3 mmol/L — ABNORMAL LOW (ref 3.5–5.1)
Sodium: 139 mmol/L (ref 135–145)
Total Bilirubin: 0.6 mg/dL (ref 0.3–1.2)
Total Protein: 7.8 g/dL (ref 6.5–8.1)

## 2020-10-19 LAB — URIC ACID: Uric Acid, Serum: 6.2 mg/dL (ref 2.5–7.1)

## 2020-10-19 NOTE — Progress Notes (Signed)
Hematology/Oncology Consult note [facility]  Telephone:([phone] [phone]  Patient Care Team: Rusty Aus, MD as PCP - General (Internal Medicine) Beverly Gust, MD (Otolaryngology) Catheryn Bacon, CNM (Inactive) as Midwife (Obstetrics and Gynecology) Yolonda Kida, MD as Consulting Physician (Cardiology)   Name of the patient: Alexandra Moore  [id number]  04-14-1951   Date of visit: 10/19/20  Diagnosis-stage IV follicular lymphoma under observation  Chief complaint/ Reason for visit-routine follow-up of follicular lymphoma  Heme/Onc history: Patient is a 70 year old female 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  Ultrasound-guided left supraclavicular lymph node biopsy showed grade 1 through 2 follicular lymphoma.  She has had waxing and waning neck adenopathy as well as axillary adenopathy in the past.  She has not required any treatment for her follicular lymphoma so far.  PET scan on 07/24/2016 revealed hypermetabolic adenopathy in the neck, chest, abdomen, and pelvis, compatible with lymphoma.  Index nodes included a level IIa neck node of 1.8 cm (SUV 9.7), left axillary node 1.8 cm (SUV 6.9), AP window node 1.4 cm (SUV 6.9), 1.4 cm left periaortic lymph node (SUV 7.9), 2.7 cm right external iliac node (SUV 9.2), and right inguinal node (SUV 8.9).  There was mild marrow heterogeneity of activity but without a well-defined region of osseous malignant involvement.  Bone marrow aspirate and biopsy on  07/31/2016 revealed a slightly hypercellular marrow for age with atypical lymphoid aggregates and trilineage hematopoiesis.  Flow cytometry revealed a minor B cell population with lambda light chain excess representing < 10% of the lymphocytes with expression of pan B-cell antigens including CD20.  These findings are worrisome but not definitive for minimal involvement by a B-cell lymphoproliferative process.  Overall findings  are consistent with involvment by non-Hodgkin's lymphoma.  Interval history-patient's husband recently died from metastatic prostate cancer and he was my patient as well.  She is coping up with his last well so far.  Continues to remain active.  Appetite and weight have remained stable.  She has lost some weight since the death of her husband but reports that it is currently stabilizing.  She denies drenching night sweats.  ECOG PS- 1 Pain scale- 0  Review of systems- Review of Systems  Constitutional:  Negative for chills, fever, malaise/fatigue and weight loss.  HENT:  Negative for congestion, ear discharge and nosebleeds.   Eyes:  Negative for blurred vision.  Respiratory:  Negative for cough, hemoptysis, sputum production, shortness of breath and wheezing.   Cardiovascular:  Negative for chest pain, palpitations, orthopnea and claudication.  Gastrointestinal:  Negative for abdominal pain, blood in stool, constipation, diarrhea, heartburn, melena, nausea and vomiting.  Genitourinary:  Negative for dysuria, flank pain, frequency, hematuria and urgency.  Musculoskeletal:  Negative for back pain, joint pain and myalgias.  Skin:  Negative for rash.  Neurological:  Negative for dizziness, tingling, focal weakness, seizures, weakness and headaches.  Endo/Heme/Allergies:  Does not bruise/bleed easily.  Psychiatric/Behavioral:  Negative for depression and suicidal ideas. The patient does not have insomnia.      Allergies  Allergen Reactions   Diphenhydramine Hcl Other (See Comments)    Chills   Sulfa Antibiotics Hives     Past Medical History:  Diagnosis Date   Cancer ([place])    non hodgkins lymphoma- ? in remission   Hypertension      No past surgical history on file.  Social History   Socioeconomic History   Marital status: Widowed  Spouse name: Not on file   Number of children: Not on file   Years of education: Not on file   Highest education level: Not on file   Occupational History   Not on file  Tobacco Use   Smoking status: Never   Smokeless tobacco: Never  Substance and Sexual Activity   Alcohol use: Yes    Comment: rarely   Drug use: No   Sexual activity: Not on file  Other Topics Concern   Not on file  Social History Narrative   Not on file   Social Determinants of Health   Financial Resource Strain: Not on file  Food Insecurity: Not on file  Transportation Needs: Not on file  Physical Activity: Not on file  Stress: Not on file  Social Connections: Not on file  Intimate Partner Violence: Not on file    Family History  Problem Relation Age of Onset   Breast cancer Sister 12     Current Outpatient Medications:    amLODipine (NORVASC) 5 MG tablet, Take 5 mg by mouth daily., Disp: , Rfl:    Cholecalciferol (VITAMIN D3) 1000 units CAPS, Take 1,000 Units by mouth daily., Disp: , Rfl:    Ferrous Sulfate (IRON) 28 MG TABS, Take by mouth., Disp: , Rfl:    Flaxseed Oil OIL, Use once daily., Disp: , Rfl:    GLUCOSAMINE SULFATE PO, Take 1 tablet by mouth daily., Disp: , Rfl:    magnesium gluconate (MAGONATE) 500 MG tablet, Take 500 mg by mouth 2 (two) times daily., Disp: , Rfl:    Multiple Vitamin (MULTI-VITAMINS) TABS, Take 1 tablet by mouth daily., Disp: , Rfl:    Potassium 99 MG TABS, Take 1 tablet by mouth 2 (two) times daily., Disp: , Rfl:    Probiotic Product (PROBIOTIC-10) CAPS, Take 70 mg by mouth daily., Disp: , Rfl:    TURMERIC PO, Take 400 mg by mouth daily., Disp: , Rfl:    vitamin E 400 UNIT capsule, Take 400 Units by mouth daily., Disp: , Rfl:   Physical exam:  Vitals:   10/19/20 0945  BP: 137/69  Pulse: 70  Resp: 18  Temp: (!) 96.5 F (35.8 C)  SpO2: 100%  Weight: 199 lb 13.6 oz (90.6 kg)   Physical Exam Constitutional:      General: She is not in acute distress. Cardiovascular:     Rate and Rhythm: Normal rate and regular rhythm.     Heart sounds: Normal heart sounds.  Pulmonary:     Effort:  Pulmonary effort is normal.     Breath sounds: Normal breath sounds.  Abdominal:     General: Bowel sounds are normal.     Palpations: Abdomen is soft.  Lymphadenopathy:     Comments: No palpable cervical, supraclavicular, axillary or inguinal adenopathy    Skin:    General: Skin is warm and dry.  Neurological:     Mental Status: She is alert and oriented to person, place, and time.     CMP Latest Ref Rng & Units 10/19/2020  Glucose 70 - 99 mg/dL 85  BUN 8 - 23 mg/dL 21  Creatinine 0.44 - 1.00 mg/dL 0.72  Sodium 135 - 145 mmol/L 139  Potassium 3.5 - 5.1 mmol/L 3.0(L)  Chloride 98 - 111 mmol/L 100  CO2 22 - 32 mmol/L 31  Calcium 8.9 - 10.3 mg/dL 9.0  Total Protein 6.5 - 8.1 g/dL 7.8  Total Bilirubin 0.3 - 1.2 mg/dL 0.6  Alkaline Phos 38 -  126 U/L 98  AST 15 - 41 U/L 21  ALT 0 - 44 U/L 23   CBC Latest Ref Rng & Units 10/19/2020  WBC 4.0 - 10.5 K/uL 5.5  Hemoglobin 12.0 - 15.0 g/dL 14.0  Hematocrit 36.0 - 46.0 % 41.5  Platelets 150 - 400 K/uL 281      Assessment and plan- Patient is a 70 y.o. female with history of follicular lymphoma under observation here for routine follow-up  Clinically patient is doing well with no concerning B symptoms or palpable adenopathy.  Last PET scan was in 2018 but she does not require any surveillance PET scans unless there is concern for worsening adenopathy or B symptoms.  I will see her back in 6 months  Chronic hypokalemia: I have encouraged her to speak to Dr. Sabra Heck about it.  Patient will need to stay on long-term potassium supplements as well as get her magnesium levels checked in the future.  I will not be checking any further uric acid levels.    Visit Diagnosis 1. Grade 2 follicular lymphoma of lymph nodes of multiple regions [facility])      Dr. Randa Evens, MD, MPH [facility] [phone] 10/19/2020 12:20 PM

## 2020-10-19 NOTE — Progress Notes (Signed)
Pts husband passed away May 16th.

## 2020-11-17 ENCOUNTER — Other Ambulatory Visit: Payer: Self-pay | Admitting: Obstetrics and Gynecology

## 2020-11-17 DIAGNOSIS — Z1231 Encounter for screening mammogram for malignant neoplasm of breast: Secondary | ICD-10-CM

## 2020-12-27 ENCOUNTER — Ambulatory Visit: Payer: Medicare PPO

## 2021-01-11 ENCOUNTER — Ambulatory Visit
Admission: RE | Admit: 2021-01-11 | Discharge: 2021-01-11 | Disposition: A | Discharge status: Home / Self Care | Payer: Medicare PPO | Source: Ambulatory Visit | Attending: Obstetrics and Gynecology | Admitting: Obstetrics and Gynecology

## 2021-01-11 ENCOUNTER — Other Ambulatory Visit: Payer: Self-pay

## 2021-01-11 DIAGNOSIS — Z1231 Encounter for screening mammogram for malignant neoplasm of breast: Secondary | ICD-10-CM | POA: Diagnosis not present

## 2021-04-19 ENCOUNTER — Ambulatory Visit: Payer: Medicare PPO | Admitting: Oncology

## 2021-04-19 ENCOUNTER — Other Ambulatory Visit: Payer: Medicare PPO

## 2021-04-25 ENCOUNTER — Telehealth: Payer: Self-pay | Admitting: Oncology

## 2021-04-25 NOTE — Telephone Encounter (Signed)
Pt called to see if she should reschedule her appt for 1-4 because her daughter tested pos for Covid and she lives with pt. Pt tested neg. Call back at [phone]

## 2021-04-26 ENCOUNTER — Inpatient Hospital Stay: Payer: Medicare PPO | Admitting: Oncology

## 2021-04-26 ENCOUNTER — Inpatient Hospital Stay: Payer: Medicare PPO

## 2021-05-09 ENCOUNTER — Inpatient Hospital Stay: Payer: Medicare PPO | Admitting: Oncology

## 2021-05-09 ENCOUNTER — Encounter: Payer: Self-pay | Admitting: Oncology

## 2021-05-09 ENCOUNTER — Other Ambulatory Visit: Payer: Self-pay

## 2021-05-09 ENCOUNTER — Inpatient Hospital Stay: Payer: Medicare PPO | Attending: Oncology

## 2021-05-09 VITALS — BP 153/89 | HR 78 | Temp 97.9°F | Wt 209.9 lb

## 2021-05-09 DIAGNOSIS — Z8572 Personal history of non-Hodgkin lymphomas: Secondary | ICD-10-CM | POA: Diagnosis not present

## 2021-05-09 DIAGNOSIS — Z08 Encounter for follow-up examination after completed treatment for malignant neoplasm: Secondary | ICD-10-CM

## 2021-05-09 DIAGNOSIS — E790 Hyperuricemia without signs of inflammatory arthritis and tophaceous disease: Secondary | ICD-10-CM | POA: Insufficient documentation

## 2021-05-09 DIAGNOSIS — Z803 Family history of malignant neoplasm of breast: Secondary | ICD-10-CM | POA: Diagnosis not present

## 2021-05-09 DIAGNOSIS — E876 Hypokalemia: Secondary | ICD-10-CM

## 2021-05-09 DIAGNOSIS — C8218 Follicular lymphoma grade II, lymph nodes of multiple sites: Secondary | ICD-10-CM | POA: Insufficient documentation

## 2021-05-09 LAB — COMPREHENSIVE METABOLIC PANEL
ALT: 24 U/L (ref 0–44)
AST: 25 U/L (ref 15–41)
Albumin: 4.7 g/dL (ref 3.5–5.0)
Alkaline Phosphatase: 107 U/L (ref 38–126)
Anion gap: 10 (ref 5–15)
BUN: 17 mg/dL (ref 8–23)
CO2: 31 mmol/L (ref 22–32)
Calcium: 9.2 mg/dL (ref 8.9–10.3)
Chloride: 99 mmol/L (ref 98–111)
Creatinine, Ser: 0.75 mg/dL (ref 0.44–1.00)
GFR, Estimated: >60 mL/min (ref >60)
Glucose, Bld: 89 mg/dL (ref 70–99)
Potassium: 3.2 mmol/L — ABNORMAL LOW (ref 3.5–5.1)
Sodium: 140 mmol/L (ref 135–145)
Total Bilirubin: 0.6 mg/dL (ref 0.3–1.2)
Total Protein: 7.7 g/dL (ref 6.5–8.1)

## 2021-05-09 LAB — CBC WITH DIFFERENTIAL/PLATELET
Abs Immature Granulocytes: 0.02 10*3/uL (ref 0.00–0.07)
Basophils Absolute: 0 10*3/uL (ref 0.0–0.1)
Basophils Relative: 1 %
Eosinophils Absolute: 0.2 10*3/uL (ref 0.0–0.5)
Eosinophils Relative: 3 %
HCT: 44.6 % (ref 36.0–46.0)
Hemoglobin: 14.8 g/dL (ref 12.0–15.0)
Immature Granulocytes: 0 %
Lymphocytes Relative: 30 %
Lymphs Abs: 2 10*3/uL (ref 0.7–4.0)
MCH: 29 pg (ref 26.0–34.0)
MCHC: 33.2 g/dL (ref 30.0–36.0)
MCV: 87.3 fL (ref 80.0–100.0)
Monocytes Absolute: 0.5 10*3/uL (ref 0.1–1.0)
Monocytes Relative: 7 %
Neutro Abs: 3.8 10*3/uL (ref 1.7–7.7)
Neutrophils Relative %: 59 %
Platelets: 291 10*3/uL (ref 150–400)
RBC: 5.11 MIL/uL (ref 3.87–5.11)
RDW: 14.5 % (ref 11.5–15.5)
WBC: 6.5 10*3/uL (ref 4.0–10.5)
nRBC: 0 % (ref 0.0–0.2)

## 2021-05-09 NOTE — Progress Notes (Signed)
Pt is doing well.  She just learned that more family is having trouble with Mitrovalve Heart problems.

## 2021-05-09 NOTE — Progress Notes (Signed)
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  Ultrasound-guided left supraclavicular lymph node biopsy showed grade 1 through 2 follicular lymphoma.  She has had waxing and waning neck adenopathy as well as axillary adenopathy in the past.  She has not required any treatment for her follicular lymphoma so far.   PET scan on 07/24/2016 revealed hypermetabolic adenopathy in the neck, chest, abdomen, and pelvis, compatible with lymphoma.  Index nodes included a level IIa neck node of 1.8 cm (SUV 9.7), left axillary node 1.8 cm (SUV 6.9), AP window node 1.4 cm (SUV 6.9), 1.4 cm left periaortic lymph node (SUV 7.9), 2.7 cm right external iliac node (SUV 9.2), and right inguinal node (SUV 8.9).  There was mild marrow heterogeneity of activity but without a well-defined region of osseous malignant involvement.   Bone marrow aspirate and biopsy on  07/31/2016 revealed a slightly hypercellular marrow for age with atypical lymphoid aggregates and trilineage hematopoiesis.  Flow cytometry revealed a minor B cell population with lambda light chain excess representing < 10% of the lymphocytes with expression of pan B-cell antigens including CD20.  These findings are worrisome but not definitive for minimal involvement by a B-cell lymphoproliferative process.  Overall  findings are consistent with involvment by non-Hodgkin's lymphoma.  Patient has not required any treatment for follicular lymphoma here.  Interval history-overall patient is doing well and denies any specific complaints at this time.  Appetite and weight have remained stable.  Denies any unintentional weight loss or drenching night sweats.  ECOG PS- 1 Pain scale- 0  Review of systems- Review of Systems  Constitutional:  Negative for chills, fever, malaise/fatigue and weight loss.  HENT:  Negative for congestion, ear discharge and nosebleeds.   Eyes:  Negative for blurred vision.  Respiratory:  Negative for cough, hemoptysis, sputum production, shortness of breath and wheezing.   Cardiovascular:  Negative for chest pain, palpitations, orthopnea and claudication.  Gastrointestinal:  Negative for abdominal pain, blood in stool, constipation, diarrhea, heartburn, melena, nausea and vomiting.  Genitourinary:  Negative for dysuria, flank pain, frequency, hematuria and urgency.  Musculoskeletal:  Negative for back pain, joint pain and myalgias.  Skin:  Negative for rash.  Neurological:  Negative for dizziness, tingling, focal weakness, seizures, weakness and headaches.  Endo/Heme/Allergies:  Does not bruise/bleed easily.  Psychiatric/Behavioral:  Negative for depression and suicidal ideas. The patient does not have insomnia.      Allergies  Allergen Reactions   Diphenhydramine Hcl Other (See Comments)    Chills   Sulfa Antibiotics Hives     Past Medical History:  Diagnosis Date   Cancer ([place])    non hodgkins lymphoma- ? in remission   Hypertension      History reviewed. No pertinent surgical history.  Social History   Socioeconomic History   Marital status: Widowed    Spouse name: Not on file   Number of children: Not on file   Years of  education: Not on file   Highest education level: Not on file  Occupational History   Not on file  Tobacco Use   Smoking status: Never    Smokeless tobacco: Never  Substance and Sexual Activity   Alcohol use: Yes    Comment: rarely   Drug use: No   Sexual activity: Not on file  Other Topics Concern   Not on file  Social History Narrative   Not on file   Social Determinants of Health   Financial Resource Strain: Not on file  Food Insecurity: Not on file  Transportation Needs: Not on file  Physical Activity: Not on file  Stress: Not on file  Social Connections: Not on file  Intimate Partner Violence: Not on file    Family History  Problem Relation Age of Onset   Breast cancer Sister 60     Current Outpatient Medications:    amLODipine (NORVASC) 5 MG tablet, Take 5 mg by mouth daily., Disp: , Rfl:    Cholecalciferol (VITAMIN D3) 1000 units CAPS, Take 1,000 Units by mouth daily., Disp: , Rfl:    Ferrous Sulfate (IRON) 28 MG TABS, Take by mouth., Disp: , Rfl:    Flaxseed Oil OIL, Use once daily., Disp: , Rfl:    GLUCOSAMINE SULFATE PO, Take 1 tablet by mouth daily., Disp: , Rfl:    magnesium gluconate (MAGONATE) 500 MG tablet, Take 500 mg by mouth 2 (two) times daily., Disp: , Rfl:    Multiple Vitamin (MULTI-VITAMINS) TABS, Take 1 tablet by mouth daily., Disp: , Rfl:    Potassium 99 MG TABS, Take 1 tablet by mouth 2 (two) times daily., Disp: , Rfl:    Probiotic Product (PROBIOTIC-10) CAPS, Take 70 mg by mouth daily., Disp: , Rfl:    TURMERIC PO, Take 400 mg by mouth daily., Disp: , Rfl:    vitamin E 400 UNIT capsule, Take 400 Units by mouth daily., Disp: , Rfl:   Physical exam:  Vitals:   05/09/21 1009  BP: (!) 153/89  Pulse: 78  Temp: 97.9 F (36.6 C)  TempSrc: Tympanic  Weight: 209 lb 14.4 oz (95.2 kg)   Physical Exam Constitutional:      General: She is not in acute distress. Cardiovascular:     Rate and Rhythm: Normal rate and regular rhythm.     Heart sounds: Normal heart sounds.  Pulmonary:     Effort: Pulmonary effort is normal.     Breath sounds: Normal breath sounds.  Abdominal:      General: Bowel sounds are normal.     Palpations: Abdomen is soft.     Comments: No palpable hepatosplenomegaly  Lymphadenopathy:     Comments: No palpable cervical, supraclavicular, axillary or inguinal adenopathy    Skin:    General: Skin is warm and dry.  Neurological:     Mental Status: She is alert and oriented to person, place, and time.     CMP Latest Ref Rng & Units 05/09/2021  Glucose 70 - 99 mg/dL 89  BUN 8 - 23 mg/dL 17  Creatinine 0.44 - 1.00 mg/dL 0.75  Sodium 135 - 145 mmol/L 140  Potassium 3.5 - 5.1 mmol/L 3.2(L)  Chloride 98 - 111 mmol/L 99  CO2 22 - 32 mmol/L 31  Calcium 8.9 - 10.3 mg/dL 9.2  Total Protein 6.5 - 8.1 g/dL 7.7  Total Bilirubin 0.3 - 1.2 mg/dL 0.6  Alkaline Phos 38 - 126 U/L 107  AST 15 - 41 U/L 25  ALT  0 - 44 U/L 24   CBC Latest Ref Rng & Units 05/09/2021  WBC 4.0 - 10.5 K/uL 6.5  Hemoglobin 12.0 - 15.0 g/dL 14.8  Hematocrit 36.0 - 46.0 % 44.6  Platelets 150 - 400 K/uL 291     Assessment and plan- Patient is a 71 y.o. female with history of follicular lymphoma under observation here for routine follow-up  Overall patient is doing well with no concerning signs and symptoms of recurrence.  No B symptoms.  Labs including CBC with differential CMP are normal.  Clinically no adenopathy palpable.  She is now almost 5 years out from the diagnosis of follicular lymphoma which has never required any treatment so far.  No role for surveillance imaging at this time.  I will see her back in 1 year with labs and she will call us sooner if any questions or concerns arise   Visit Diagnosis 1. Encounter for follow-up surveillance of lymphoma   2. Elevated uric acid in blood   3. Hypokalemia      Dr. Randa Evens, MD, MPH [facility] at [facility] [phone] 05/09/2021 1:53 PM

## 2021-11-28 ENCOUNTER — Other Ambulatory Visit: Payer: Self-pay | Admitting: Obstetrics and Gynecology

## 2021-11-28 DIAGNOSIS — Z1231 Encounter for screening mammogram for malignant neoplasm of breast: Secondary | ICD-10-CM

## 2022-01-15 ENCOUNTER — Ambulatory Visit
Admission: RE | Admit: 2022-01-15 | Discharge: 2022-01-15 | Disposition: A | Discharge status: Home / Self Care | Payer: Medicare PPO | Source: Ambulatory Visit | Attending: Obstetrics and Gynecology | Admitting: Obstetrics and Gynecology

## 2022-01-15 DIAGNOSIS — Z1231 Encounter for screening mammogram for malignant neoplasm of breast: Secondary | ICD-10-CM | POA: Insufficient documentation

## 2022-01-25 ENCOUNTER — Other Ambulatory Visit: Payer: Self-pay | Admitting: Obstetrics and Gynecology

## 2022-01-25 DIAGNOSIS — N63 Unspecified lump in unspecified breast: Secondary | ICD-10-CM

## 2022-01-25 DIAGNOSIS — R928 Other abnormal and inconclusive findings on diagnostic imaging of breast: Secondary | ICD-10-CM

## 2022-02-23 ENCOUNTER — Ambulatory Visit
Admission: RE | Admit: 2022-02-23 | Discharge: 2022-02-23 | Disposition: A | Discharge status: Home / Self Care | Payer: Medicare PPO | Source: Ambulatory Visit | Attending: Obstetrics and Gynecology | Admitting: Obstetrics and Gynecology

## 2022-02-23 DIAGNOSIS — R928 Other abnormal and inconclusive findings on diagnostic imaging of breast: Secondary | ICD-10-CM

## 2022-02-23 DIAGNOSIS — N63 Unspecified lump in unspecified breast: Secondary | ICD-10-CM

## 2022-02-26 ENCOUNTER — Other Ambulatory Visit: Payer: Self-pay | Admitting: Obstetrics and Gynecology

## 2022-02-26 DIAGNOSIS — R928 Other abnormal and inconclusive findings on diagnostic imaging of breast: Secondary | ICD-10-CM

## 2022-02-26 DIAGNOSIS — N63 Unspecified lump in unspecified breast: Secondary | ICD-10-CM

## 2022-02-27 ENCOUNTER — Other Ambulatory Visit: Payer: Self-pay | Admitting: Obstetrics and Gynecology

## 2022-02-27 DIAGNOSIS — N63 Unspecified lump in unspecified breast: Secondary | ICD-10-CM

## 2022-05-09 ENCOUNTER — Ambulatory Visit: Payer: Medicare PPO | Admitting: Oncology

## 2022-05-09 ENCOUNTER — Other Ambulatory Visit: Payer: Medicare PPO

## 2022-05-21 ENCOUNTER — Other Ambulatory Visit: Payer: Self-pay

## 2022-05-21 DIAGNOSIS — C8218 Follicular lymphoma grade II, lymph nodes of multiple sites: Secondary | ICD-10-CM

## 2022-05-21 DIAGNOSIS — Z08 Encounter for follow-up examination after completed treatment for malignant neoplasm: Secondary | ICD-10-CM

## 2022-05-22 ENCOUNTER — Inpatient Hospital Stay: Payer: Medicare PPO | Attending: Oncology | Admitting: Oncology

## 2022-05-22 ENCOUNTER — Encounter: Payer: Self-pay | Admitting: Oncology

## 2022-05-22 ENCOUNTER — Inpatient Hospital Stay: Payer: Medicare PPO

## 2022-05-22 VITALS — BP 165/96 | HR 73 | Temp 97.1°F | Ht 61.0 in | Wt 210.0 lb

## 2022-05-22 DIAGNOSIS — Z08 Encounter for follow-up examination after completed treatment for malignant neoplasm: Secondary | ICD-10-CM

## 2022-05-22 DIAGNOSIS — C8218 Follicular lymphoma grade II, lymph nodes of multiple sites: Secondary | ICD-10-CM | POA: Diagnosis present

## 2022-05-22 DIAGNOSIS — Z8572 Personal history of non-Hodgkin lymphomas: Secondary | ICD-10-CM

## 2022-05-22 DIAGNOSIS — E876 Hypokalemia: Secondary | ICD-10-CM | POA: Diagnosis not present

## 2022-05-22 DIAGNOSIS — Z803 Family history of malignant neoplasm of breast: Secondary | ICD-10-CM | POA: Insufficient documentation

## 2022-05-22 LAB — CBC WITH DIFFERENTIAL/PLATELET
Abs Immature Granulocytes: 0.01 10*3/uL (ref 0.00–0.07)
Basophils Absolute: 0 10*3/uL (ref 0.0–0.1)
Basophils Relative: 1 %
Eosinophils Absolute: 0.2 10*3/uL (ref 0.0–0.5)
Eosinophils Relative: 4 %
HCT: 43.2 % (ref 36.0–46.0)
Hemoglobin: 14.1 g/dL (ref 12.0–15.0)
Immature Granulocytes: 0 %
Lymphocytes Relative: 31 %
Lymphs Abs: 1.5 10*3/uL (ref 0.7–4.0)
MCH: 28.5 pg (ref 26.0–34.0)
MCHC: 32.6 g/dL (ref 30.0–36.0)
MCV: 87.4 fL (ref 80.0–100.0)
Monocytes Absolute: 0.4 10*3/uL (ref 0.1–1.0)
Monocytes Relative: 9 %
Neutro Abs: 2.8 10*3/uL (ref 1.7–7.7)
Neutrophils Relative %: 55 %
Platelets: 282 10*3/uL (ref 150–400)
RBC: 4.94 MIL/uL (ref 3.87–5.11)
RDW: 14.9 % (ref 11.5–15.5)
WBC: 5 10*3/uL (ref 4.0–10.5)
nRBC: 0 % (ref 0.0–0.2)

## 2022-05-22 LAB — COMPREHENSIVE METABOLIC PANEL
ALT: 27 U/L (ref 0–44)
AST: 23 U/L (ref 15–41)
Albumin: 4.6 g/dL (ref 3.5–5.0)
Alkaline Phosphatase: 100 U/L (ref 38–126)
Anion gap: 12 (ref 5–15)
BUN: 21 mg/dL (ref 8–23)
CO2: 30 mmol/L (ref 22–32)
Calcium: 9 mg/dL (ref 8.9–10.3)
Chloride: 99 mmol/L (ref 98–111)
Creatinine, Ser: 0.79 mg/dL (ref 0.44–1.00)
GFR, Estimated: >60 mL/min (ref >60)
Glucose, Bld: 94 mg/dL (ref 70–99)
Potassium: 2.8 mmol/L — ABNORMAL LOW (ref 3.5–5.1)
Sodium: 141 mmol/L (ref 135–145)
Total Bilirubin: 0.6 mg/dL (ref 0.3–1.2)
Total Protein: 7.6 g/dL (ref 6.5–8.1)

## 2022-05-22 LAB — LACTATE DEHYDROGENASE: LDH: 72 U/L — ABNORMAL LOW (ref 98–192)

## 2022-05-22 MED ORDER — POTASSIUM CHLORIDE CRYS ER 20 MEQ PO TBCR: 20.0000 meq | EXTENDED_RELEASE_TABLET | Freq: Every day | ORAL | 0 refills | Status: DC

## 2022-05-22 NOTE — Progress Notes (Signed)
Hematology/Oncology Consult note [facility]  Telephone:([phone] [phone]  Patient Care Team: Rusty Aus, MD as PCP - General (Internal Medicine) Beverly Gust, MD (Otolaryngology) Catheryn Bacon, CNM (Inactive) as Midwife (Obstetrics and Gynecology) Yolonda Kida, MD as Consulting Physician (Cardiology) Sindy Guadeloupe, MD as Consulting Physician (Oncology)   Name of the patient: Alexandra Moore  [id number]  [birth year]   Date of visit: 05/22/22  Diagnosis-stage IV follicular lymphoma under observation  Chief complaint/ Reason for visit-routine follow-up of follicular lymphoma  Heme/Onc history: Patient is a 72 year old female 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  Ultrasound-guided left supraclavicular lymph node biopsy showed grade 1 through 2 follicular lymphoma.  She has had waxing and waning neck adenopathy as well as axillary adenopathy in the past.  She has not required any treatment for her follicular lymphoma so far.   PET scan on 07/24/2016 revealed hypermetabolic adenopathy in the neck, chest, abdomen, and pelvis, compatible with lymphoma.  Index nodes included a level IIa neck node of 1.8 cm (SUV 9.7), left axillary node 1.8 cm (SUV 6.9), AP window node 1.4 cm (SUV 6.9), 1.4 cm left periaortic lymph node (SUV 7.9), 2.7 cm right external iliac node (SUV 9.2), and right inguinal node (SUV 8.9).  There was mild marrow heterogeneity of activity but without a well-defined region of osseous malignant involvement.   Bone marrow aspirate and biopsy on  07/31/2016 revealed a slightly hypercellular marrow for age with atypical lymphoid aggregates and trilineage hematopoiesis.  Flow cytometry revealed a minor B cell population with lambda light chain excess representing < 10% of the lymphocytes with expression of pan B-cell antigens including CD20.  These findings are worrisome but not definitive for minimal involvement  by a B-cell lymphoproliferative process.  Overall findings are consistent with involvment by non-Hodgkin's lymphoma.  Patient has not required any treatment for follicular lymphoma here.  Interval history-patient feels well and deniesSpecific complaints at this time.  Appetite and weight have remained stable.  Denies any recurrent infections, fatigue, drenching night sweats  ECOG PS- 0 Pain scale- 0   Review of systems- Review of Systems  Constitutional:  Negative for chills, fever, malaise/fatigue and weight loss.  HENT:  Negative for congestion, ear discharge and nosebleeds.   Eyes:  Negative for blurred vision.  Respiratory:  Negative for cough, hemoptysis, sputum production, shortness of breath and wheezing.   Cardiovascular:  Negative for chest pain, palpitations, orthopnea and claudication.  Gastrointestinal:  Negative for abdominal pain, blood in stool, constipation, diarrhea, heartburn, melena, nausea and vomiting.  Genitourinary:  Negative for dysuria, flank pain, frequency, hematuria and urgency.  Musculoskeletal:  Negative for back pain, joint pain and myalgias.  Skin:  Negative for rash.  Neurological:  Negative for dizziness, tingling, focal weakness, seizures, weakness and headaches.  Endo/Heme/Allergies:  Does not bruise/bleed easily.  Psychiatric/Behavioral:  Negative for depression and suicidal ideas. The patient does not have insomnia.       Allergies  Allergen Reactions   Diphenhydramine Hcl Other (See Comments)    Chills   Sulfa Antibiotics Hives     Past Medical History:  Diagnosis Date   Cancer ([place])    non hodgkins lymphoma- ? in remission   Hypertension      No past surgical history on file.  Social History   Socioeconomic History   Marital status: Widowed    Spouse name: Not on file   Number of children: Not on file  Years of education: Not on file   Highest education level: Not on file  Occupational History   Not on file  Tobacco Use    Smoking status: Never   Smokeless tobacco: Never  Substance and Sexual Activity   Alcohol use: Yes    Comment: rarely   Drug use: No   Sexual activity: Not on file  Other Topics Concern   Not on file  Social History Narrative   Not on file   Social Determinants of Health   Financial Resource Strain: Not on file  Food Insecurity: Not on file  Transportation Needs: Not on file  Physical Activity: Not on file  Stress: Not on file  Social Connections: Not on file  Intimate Partner Violence: Not on file    Family History  Problem Relation Age of Onset   Breast cancer Sister 82     Current Outpatient Medications:    amLODipine (NORVASC) 5 MG tablet, Take 5 mg by mouth daily., Disp: , Rfl:    Cholecalciferol (VITAMIN D3) 1000 units CAPS, Take 1,000 Units by mouth daily., Disp: , Rfl:    Ferrous Sulfate (IRON) 28 MG TABS, Take by mouth., Disp: , Rfl:    Flaxseed Oil OIL, Use once daily., Disp: , Rfl:    Garlic 1093 MG CAPS, , Disp: , Rfl:    GLUCOSAMINE SULFATE PO, Take 1 tablet by mouth daily., Disp: , Rfl:    magnesium gluconate (MAGONATE) 500 MG tablet, Take 500 mg by mouth 2 (two) times daily., Disp: , Rfl:    Multiple Vitamin (MULTI-VITAMINS) TABS, Take 1 tablet by mouth daily., Disp: , Rfl:    Oregano 1500 MG CAPS, , Disp: , Rfl:    Potassium 99 MG TABS, Take 1 tablet by mouth 2 (two) times daily., Disp: , Rfl:    potassium chloride SA (KLOR-CON M) 20 MEQ tablet, Take 1 tablet (20 mEq total) by mouth daily., Disp: 14 tablet, Rfl: 0   Probiotic Product (PROBIOTIC-10) CAPS, Take 70 mg by mouth daily., Disp: , Rfl:    TURMERIC PO, Take 400 mg by mouth daily., Disp: , Rfl:    vitamin E 400 UNIT capsule, Take 400 Units by mouth daily., Disp: , Rfl:   Physical exam:  Vitals:   05/22/22 1029  BP: (!) 165/96  Pulse: 73  Temp: (!) 97.1 F (36.2 C)  TempSrc: Tympanic  Weight: 210 lb (95.3 kg)  Height: '5\' 1"'$  (1.549 m)   Physical Exam Cardiovascular:     Rate and Rhythm:  Normal rate and regular rhythm.     Heart sounds: Normal heart sounds.  Pulmonary:     Effort: Pulmonary effort is normal.     Breath sounds: Normal breath sounds.  Abdominal:     General: Bowel sounds are normal.     Palpations: Abdomen is soft.     Comments: No palpable hepatosplenomegaly  Lymphadenopathy:     Comments: No palpable cervical, supraclavicular, axillary or inguinal adenopathy    Skin:    General: Skin is warm and dry.  Neurological:     Mental Status: She is alert and oriented to person, place, and time.         Latest Ref Rng & Units 05/22/2022    9:40 AM  CMP  Glucose 70 - 99 mg/dL 94   BUN 8 - 23 mg/dL 21   Creatinine 0.44 - 1.00 mg/dL 0.79   Sodium 135 - 145 mmol/L 141   Potassium 3.5 - 5.1 mmol/L 2.8  Chloride 98 - 111 mmol/L 99   CO2 22 - 32 mmol/L 30   Calcium 8.9 - 10.3 mg/dL 9.0   Total Protein 6.5 - 8.1 g/dL 7.6   Total Bilirubin 0.3 - 1.2 mg/dL 0.6   Alkaline Phos 38 - 126 U/L 100   AST 15 - 41 U/L 23   ALT 0 - 44 U/L 27       Latest Ref Rng & Units 05/22/2022    9:40 AM  CBC  WBC 4.0 - 10.5 K/uL 5.0   Hemoglobin 12.0 - 15.0 g/dL 14.1   Hematocrit 36.0 - 46.0 % 43.2   Platelets 150 - 400 K/uL 282      Assessment and plan- Patient is a 72 y.o. female with history of follicular lymphoma under observation here for routine follow-up  Clinically patient does not have any palpable adenopathy or splenomegaly.  No B symptoms.  No significant cytopenias.  Her last baseline imaging wasIn 2018 and PET scan back then had shown left supraclavicular and right level 1B adenopathy as well as mild mediastinal and left axillary adenopathy.  She also was noted to have hypermetabolic retroperitoneal and para-aortic and external iliac adenopathy.  Clinically her labs are normal and no evidence of organ compromise or symptoms.  I am holding off on any repeat imaging at this time.  I will see her back in 1 year with labs.  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  I am sending her prescription for 20 mill equivalents of oral potassium for 2 weeks and I would like her to follow-up with Dr. Sabra Heck   Visit Diagnosis 1. Encounter for follow-up surveillance of lymphoma   2. Hypokalemia      Dr. Randa Evens, MD, MPH [facility] at [facility] [phone] 05/22/2022 1:20 PM

## 2022-05-22 NOTE — Progress Notes (Signed)
Message routed to Dr. Sabra Heck with patient's potassium level for today at 2.8. and to follow up with patient after 2 weeks.   Prescription for Potassium 20 meq orally daily sent to [facility].  Patient informed to follow up with Dr. Sabra Heck.

## 2022-08-29 ENCOUNTER — Ambulatory Visit
Admission: RE | Admit: 2022-08-29 | Discharge: 2022-08-29 | Disposition: A | Discharge status: Home / Self Care | Payer: Medicare PPO | Source: Ambulatory Visit | Attending: Obstetrics and Gynecology | Admitting: Obstetrics and Gynecology

## 2022-08-29 DIAGNOSIS — N63 Unspecified lump in unspecified breast: Secondary | ICD-10-CM | POA: Diagnosis present

## 2022-08-29 DIAGNOSIS — R92323 Mammographic fibroglandular density, bilateral breasts: Secondary | ICD-10-CM | POA: Diagnosis not present

## 2022-08-29 DIAGNOSIS — Z1239 Encounter for other screening for malignant neoplasm of breast: Secondary | ICD-10-CM | POA: Diagnosis not present

## 2023-02-08 ENCOUNTER — Other Ambulatory Visit: Payer: Self-pay | Admitting: Obstetrics and Gynecology

## 2023-02-08 DIAGNOSIS — N63 Unspecified lump in unspecified breast: Secondary | ICD-10-CM

## 2023-03-05 ENCOUNTER — Ambulatory Visit
Admission: RE | Admit: 2023-03-05 | Discharge: 2023-03-05 | Disposition: A | Discharge status: Home / Self Care | Payer: Medicare PPO | Source: Ambulatory Visit | Attending: Obstetrics and Gynecology | Admitting: Obstetrics and Gynecology

## 2023-03-05 ENCOUNTER — Encounter: Payer: Self-pay | Admitting: Obstetrics and Gynecology

## 2023-03-05 ENCOUNTER — Other Ambulatory Visit: Payer: Self-pay | Admitting: Obstetrics and Gynecology

## 2023-03-05 DIAGNOSIS — N6310 Unspecified lump in the right breast, unspecified quadrant: Secondary | ICD-10-CM

## 2023-03-05 DIAGNOSIS — N6489 Other specified disorders of breast: Secondary | ICD-10-CM | POA: Diagnosis not present

## 2023-03-05 DIAGNOSIS — N63 Unspecified lump in unspecified breast: Secondary | ICD-10-CM | POA: Insufficient documentation

## 2023-03-05 DIAGNOSIS — N6313 Unspecified lump in the right breast, lower outer quadrant: Secondary | ICD-10-CM | POA: Diagnosis present

## 2023-03-05 DIAGNOSIS — N62 Hypertrophy of breast: Secondary | ICD-10-CM | POA: Diagnosis present

## 2023-03-05 HISTORY — PX: BREAST BIOPSY: SHX20

## 2023-03-05 MED ORDER — LIDOCAINE 1 % OPTIME INJ - NO CHARGE
10.0000 mL | Freq: Once | INTRAMUSCULAR | Status: AC
  Administered 2023-03-05: 10 mL
  Filled 2023-03-05: qty 10

## 2023-03-06 LAB — SURGICAL PATHOLOGY

## 2023-05-22 ENCOUNTER — Inpatient Hospital Stay: Payer: Medicare PPO | Admitting: Oncology

## 2023-05-22 ENCOUNTER — Inpatient Hospital Stay: Payer: Medicare PPO | Attending: Oncology

## 2023-05-22 ENCOUNTER — Encounter: Payer: Self-pay | Admitting: Oncology

## 2023-05-22 VITALS — BP 159/94 | HR 90 | Temp 98.2°F | Resp 17 | Ht 61.0 in | Wt 217.0 lb

## 2023-05-22 DIAGNOSIS — Z08 Encounter for follow-up examination after completed treatment for malignant neoplasm: Secondary | ICD-10-CM

## 2023-05-22 DIAGNOSIS — Z8572 Personal history of non-Hodgkin lymphomas: Secondary | ICD-10-CM

## 2023-05-22 DIAGNOSIS — E876 Hypokalemia: Secondary | ICD-10-CM | POA: Insufficient documentation

## 2023-05-22 DIAGNOSIS — Z79899 Other long term (current) drug therapy: Secondary | ICD-10-CM | POA: Diagnosis not present

## 2023-05-22 LAB — CBC WITH DIFFERENTIAL/PLATELET
Abs Immature Granulocytes: 0.02 10*3/uL (ref 0.00–0.07)
Basophils Absolute: 0.1 10*3/uL (ref 0.0–0.1)
Basophils Relative: 1 %
Eosinophils Absolute: 0.3 10*3/uL (ref 0.0–0.5)
Eosinophils Relative: 4 %
HCT: 42.7 % (ref 36.0–46.0)
Hemoglobin: 14 g/dL (ref 12.0–15.0)
Immature Granulocytes: 0 %
Lymphocytes Relative: 25 %
Lymphs Abs: 1.7 10*3/uL (ref 0.7–4.0)
MCH: 28.5 pg (ref 26.0–34.0)
MCHC: 32.8 g/dL (ref 30.0–36.0)
MCV: 86.8 fL (ref 80.0–100.0)
Monocytes Absolute: 0.5 10*3/uL (ref 0.1–1.0)
Monocytes Relative: 8 %
Neutro Abs: 4.1 10*3/uL (ref 1.7–7.7)
Neutrophils Relative %: 62 %
Platelets: 268 10*3/uL (ref 150–400)
RBC: 4.92 MIL/uL (ref 3.87–5.11)
RDW: 14.6 % (ref 11.5–15.5)
WBC: 6.6 10*3/uL (ref 4.0–10.5)
nRBC: 0 % (ref 0.0–0.2)

## 2023-05-22 LAB — LACTATE DEHYDROGENASE: LDH: 67 U/L — ABNORMAL LOW (ref 98–192)

## 2023-05-22 LAB — COMPREHENSIVE METABOLIC PANEL
ALT: 27 U/L (ref 0–44)
AST: 22 U/L (ref 15–41)
Albumin: 4.3 g/dL (ref 3.5–5.0)
Alkaline Phosphatase: 95 U/L (ref 38–126)
Anion gap: 10 (ref 5–15)
BUN: 24 mg/dL — ABNORMAL HIGH (ref 8–23)
CO2: 30 mmol/L (ref 22–32)
Calcium: 9.2 mg/dL (ref 8.9–10.3)
Chloride: 100 mmol/L (ref 98–111)
Creatinine, Ser: 0.74 mg/dL (ref 0.44–1.00)
GFR, Estimated: >60 mL/min (ref >60)
Glucose, Bld: 88 mg/dL (ref 70–99)
Potassium: 3 mmol/L — ABNORMAL LOW (ref 3.5–5.1)
Sodium: 140 mmol/L (ref 135–145)
Total Bilirubin: 0.7 mg/dL (ref 0.0–1.2)
Total Protein: 7.4 g/dL (ref 6.5–8.1)

## 2023-05-22 NOTE — Progress Notes (Signed)
Patient here for oncology follow-up appointment, expresses no new concerns at this time.

## 2023-05-22 NOTE — Progress Notes (Signed)
Hematology/Oncology Consult note [facility]  Telephone:([phone] [phone]  Patient Care Team: Danella Penton, MD as PCP - General (Internal Medicine) Linus Salmons, MD (Otolaryngology) Sharee Pimple, CNM (Inactive) as Midwife (Obstetrics and Gynecology) Alwyn Pea, MD as Consulting Physician (Cardiology) Creig Hines, MD as Consulting Physician (Oncology)   Name of the patient: Alexandra Moore  [id number]  February 12, 1951   Date of visit: 05/22/23  Diagnosis- stage IV follicular lymphoma under observation    Chief complaint/ Reason for visit-routine follow-up visit for follicular lymphoma  Heme/Onc history: Patient is a 73 year old female 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  Ultrasound-guided left supraclavicular lymph node biopsy showed grade 1 through 2 follicular lymphoma.  She has had waxing and waning neck adenopathy as well as axillary adenopathy in the past.  She has not required any treatment for her follicular lymphoma so far.   PET scan on 07/24/2016 revealed hypermetabolic adenopathy in the neck, chest, abdomen, and pelvis, compatible with lymphoma.  Index nodes included a level IIa neck node of 1.8 cm (SUV 9.7), left axillary node 1.8 cm (SUV 6.9), AP window node 1.4 cm (SUV 6.9), 1.4 cm left periaortic lymph node (SUV 7.9), 2.7 cm right external iliac node (SUV 9.2), and right inguinal node (SUV 8.9).  There was mild marrow heterogeneity of activity but without a well-defined region of osseous malignant involvement.   Bone marrow aspirate and biopsy on  07/31/2016 revealed a slightly hypercellular marrow for age with atypical lymphoid aggregates and trilineage hematopoiesis.  Flow cytometry revealed a minor B cell population with lambda light chain excess representing < 10% of the lymphocytes with expression of pan B-cell antigens including CD20.  These findings are worrisome but not definitive for minimal  involvement by a B-cell lymphoproliferative process.  Overall findings are consistent with involvment by non-Hodgkin's lymphoma.  Patient has not required any treatment for follicular lymphoma here.    Interval history-she has been going to chiropractor for muscle strain involving her right arm and left thigh.  Otherwise she feels well.  Appetite and weight have remained stable.  Denies any drenching night sweats or new lumps and bumps anywhere  ECOG PS- 0 Pain scale- 0   Review of systems- Review of Systems  Constitutional:  Negative for chills, fever, malaise/fatigue and weight loss.  HENT:  Negative for congestion, ear discharge and nosebleeds.   Eyes:  Negative for blurred vision.  Respiratory:  Negative for cough, hemoptysis, sputum production, shortness of breath and wheezing.   Cardiovascular:  Negative for chest pain, palpitations, orthopnea and claudication.  Gastrointestinal:  Negative for abdominal pain, blood in stool, constipation, diarrhea, heartburn, melena, nausea and vomiting.  Genitourinary:  Negative for dysuria, flank pain, frequency, hematuria and urgency.  Musculoskeletal:  Negative for back pain, joint pain and myalgias.  Skin:  Negative for rash.  Neurological:  Negative for dizziness, tingling, focal weakness, seizures, weakness and headaches.  Endo/Heme/Allergies:  Does not bruise/bleed easily.  Psychiatric/Behavioral:  Negative for depression and suicidal ideas. The patient does not have insomnia.       Allergies  Allergen Reactions   Diphenhydramine Hcl Other (See Comments)    Chills   Sulfa Antibiotics Hives     Past Medical History:  Diagnosis Date   Cancer ([facility])    non hodgkins lymphoma- ? in remission   Hypertension      Past Surgical History:  Procedure Laterality Date   BREAST BIOPSY Right 03/05/2023  u/s bx coil clip path pending   BREAST BIOPSY Right 03/05/2023   [place] RT BREAST BX W LOC DEV 1ST LESION IMG BX SPEC US GUIDE 03/05/2023  [facility]    Social History   Socioeconomic History   Marital status: Widowed    Spouse name: Not on file   Number of children: Not on file   Years of education: Not on file   Highest education level: Not on file  Occupational History   Not on file  Tobacco Use   Smoking status: Never   Smokeless tobacco: Never  Substance and Sexual Activity   Alcohol use: Yes    Comment: rarely   Drug use: No   Sexual activity: Not on file  Other Topics Concern   Not on file  Social History Narrative   Not on file   Social Drivers of Health   Financial Resource Strain: Not on file  Food Insecurity: Not on file  Transportation Needs: Not on file  Physical Activity: Not on file  Stress: Not on file  Social Connections: Not on file  Intimate Partner Violence: Not on file    Family History  Problem Relation Age of Onset   Breast cancer Sister 30     Current Outpatient Medications:    amLODipine (NORVASC) 5 MG tablet, Take 5 mg by mouth daily., Disp: , Rfl:    Cholecalciferol (VITAMIN D3) 1000 units CAPS, Take 1,000 Units by mouth daily., Disp: , Rfl:    Ferrous Sulfate (IRON) 28 MG TABS, Take by mouth., Disp: , Rfl:    Flaxseed Oil OIL, Use once daily., Disp: , Rfl:    GLUCOSAMINE SULFATE PO, Take 1 tablet by mouth daily., Disp: , Rfl:    magnesium gluconate (MAGONATE) 500 MG tablet, Take 500 mg by mouth 2 (two) times daily., Disp: , Rfl:    Multiple Vitamin (MULTI-VITAMINS) TABS, Take 1 tablet by mouth daily., Disp: , Rfl:    TURMERIC PO, Take 400 mg by mouth daily., Disp: , Rfl:    vitamin E 400 UNIT capsule, Take 400 Units by mouth daily., Disp: , Rfl:    Garlic 1200 MG CAPS, , Disp: , Rfl:    Oregano 1500 MG CAPS, , Disp: , Rfl:    Potassium 99 MG TABS, Take 1 tablet by mouth 2 (two) times daily., Disp: , Rfl:    potassium chloride SA (KLOR-CON M) 20 MEQ tablet, Take 1 tablet (20 mEq total) by mouth daily., Disp: 14 tablet, Rfl: 0   Probiotic Product (PROBIOTIC-10)  CAPS, Take 70 mg by mouth daily., Disp: , Rfl:   Physical exam:  Vitals:   05/22/23 1003 05/22/23 1007  BP: (!) 187/107 (!) 159/94  Pulse: 90   Resp: 17   Temp: 98.2 F (36.8 C)   TempSrc: Oral   SpO2: 96%   Weight: 217 lb (98.4 kg)   Height: 5\' 1"  (1.549 m)    Physical Exam Cardiovascular:     Rate and Rhythm: Normal rate and regular rhythm.     Heart sounds: Normal heart sounds.  Pulmonary:     Effort: Pulmonary effort is normal.     Breath sounds: Normal breath sounds.  Abdominal:     General: Bowel sounds are normal.     Palpations: Abdomen is soft.     Comments: No palpable hepatosplenomegaly  Lymphadenopathy:     Comments: No palpable cervical, supraclavicular, axillary or inguinal adenopathy    Skin:    General: Skin is warm and dry.  Neurological:     Mental Status: She is alert and oriented to person, place, and time.         Latest Ref Rng & Units 05/22/2023    9:40 AM  CMP  Glucose 70 - 99 mg/dL 88   BUN 8 - 23 mg/dL 24   Creatinine 5.18 - 1.00 mg/dL 8.41   Sodium 660 - 630 mmol/L 140   Potassium 3.5 - 5.1 mmol/L 3.0   Chloride 98 - 111 mmol/L 100   CO2 22 - 32 mmol/L 30   Calcium 8.9 - 10.3 mg/dL 9.2   Total Protein 6.5 - 8.1 g/dL 7.4   Total Bilirubin 0.0 - 1.2 mg/dL 0.7   Alkaline Phos 38 - 126 U/L 95   AST 15 - 41 U/L 22   ALT 0 - 44 U/L 27       Latest Ref Rng & Units 05/22/2023    9:40 AM  CBC  WBC 4.0 - 10.5 K/uL 6.6   Hemoglobin 12.0 - 15.0 g/dL 16.0   Hematocrit 10.9 - 46.0 % 42.7   Platelets 150 - 400 K/uL 268      Assessment and plan- Patient is a 74 y.o. female with history of follicular lymphoma currently under observation here for routine follow-up  Clinically patient is doing well with no concerning B symptoms.  No palpable adenopathy or splenomegaly on today's exam.  Labs are otherwise unremarkable other than mild chronic hypokalemia which she will follow-up with her PCP.  I am holding off on any further imaging at this time  to assess her adenopathy.  This was last checked in 2018.  I will see her back on a yearly basis   Visit Diagnosis 1. Encounter for follow-up surveillance of lymphoma      Dr. Owens Shark, MD, MPH [facility] [phone] 05/22/2023 12:58 PM

## 2023-06-22 DEATH — deceased

## 2024-05-20 ENCOUNTER — Ambulatory Visit: Payer: Medicare PPO | Admitting: Oncology

## 2024-05-20 ENCOUNTER — Other Ambulatory Visit: Payer: Medicare PPO
